# Patient Record
Sex: Female | Born: 1975 | Race: White | Hispanic: No | Marital: Married | Smoking: Never smoker
Health system: Southern US, Community
[De-identification: ages and names within clinical notes are randomized; demographics above are authoritative.]

## PROBLEM LIST (undated history)

## (undated) DIAGNOSIS — T8859XA Other complications of anesthesia, initial encounter: Secondary | ICD-10-CM

## (undated) DIAGNOSIS — J189 Pneumonia, unspecified organism: Secondary | ICD-10-CM

## (undated) DIAGNOSIS — M419 Scoliosis, unspecified: Secondary | ICD-10-CM

## (undated) DIAGNOSIS — R519 Headache, unspecified: Secondary | ICD-10-CM

## (undated) DIAGNOSIS — T7840XA Allergy, unspecified, initial encounter: Secondary | ICD-10-CM

## (undated) DIAGNOSIS — Z87440 Personal history of urinary (tract) infections: Secondary | ICD-10-CM

## (undated) DIAGNOSIS — F419 Anxiety disorder, unspecified: Secondary | ICD-10-CM

## (undated) DIAGNOSIS — R51 Headache: Secondary | ICD-10-CM

## (undated) DIAGNOSIS — Z789 Other specified health status: Secondary | ICD-10-CM

## (undated) DIAGNOSIS — Z9889 Other specified postprocedural states: Secondary | ICD-10-CM

## (undated) DIAGNOSIS — R112 Nausea with vomiting, unspecified: Secondary | ICD-10-CM

## (undated) DIAGNOSIS — T4145XA Adverse effect of unspecified anesthetic, initial encounter: Secondary | ICD-10-CM

## (undated) HISTORY — DX: Allergy, unspecified, initial encounter: T78.40XA

## (undated) HISTORY — PX: WISDOM TOOTH EXTRACTION: SHX21

## (undated) HISTORY — PX: TONSILLECTOMY: SUR1361

## (undated) HISTORY — DX: Scoliosis, unspecified: M41.9

---

## 2010-12-08 ENCOUNTER — Ambulatory Visit (INDEPENDENT_AMBULATORY_CARE_PROVIDER_SITE_OTHER): Payer: BC Managed Care – PPO | Admitting: Family Medicine

## 2010-12-08 ENCOUNTER — Ambulatory Visit: Payer: Self-pay | Admitting: Family Medicine

## 2010-12-08 ENCOUNTER — Encounter: Payer: Self-pay | Admitting: Family Medicine

## 2010-12-08 VITALS — BP 124/83 | HR 80 | Ht 64.0 in | Wt 225.0 lb

## 2010-12-08 DIAGNOSIS — M79673 Pain in unspecified foot: Secondary | ICD-10-CM | POA: Insufficient documentation

## 2010-12-08 DIAGNOSIS — M79609 Pain in unspecified limb: Secondary | ICD-10-CM

## 2010-12-08 NOTE — Progress Notes (Signed)
  Subjective:    Patient ID: Jane Mclaughlin, female    DOB: July 24, 1976, 35 y.o.   MRN: [id number]  HPI  35 yo F here for left heel pain  Patient reports slowly worsening left heel pain without injury over past 1 month. States started exercising walking and using elliptical 1 month before pain started. No running. Has stopped these activities 2/2 pain. Pain is worse at end of day, better in morning. Pain worse when pushing off on ball of foot walking. Pain located plantar aspect left heel. Tylenol not helping. Stretching has helped and feels pull where hurts at heel when she does this.  Past Medical History  Diagnosis Date  . Allergy   . Scoliosis     No current outpatient prescriptions on file prior to visit.    No past surgical history on file.  Allergies  Allergen Reactions  . Nsaids   . Penicillins     History   Social History  . Marital Status: Married    Spouse Name: N/A    Number of Children: N/A  . Years of Education: N/A   Occupational History  . Not on file.   Social History Main Topics  . Smoking status: Never Smoker   . Smokeless tobacco: Not on file  . Alcohol Use: Not on file  . Drug Use: Not on file  . Sexually Active: Not on file   Other Topics Concern  . Not on file   Social History Narrative  . No narrative on file    Family History  Problem Relation Age of Onset  . Hypertension Mother   . Heart attack Neg Hx   . Diabetes Neg Hx     There were no vitals taken for this visit.  Review of Systems See HPI above.    Objective:   Physical Exam Gen: NAD L foot/ankle: Mild overpronation but otherwise well preserved arch. No swelling, bruising, other deformity of left foot. Mild TTP plantar heel.  No TTP achilles or achilles insertion.  No TTP post tib, peroneal tendons.  No TTP within plantar fascia. FROM foot and ankle. 5/5 strength and no reproduction of pain on resisted testing Showed me that with walking, left foot behind her  in push off with heel off the ground reproduces pain plantar aspect of heel. Negative calcaneal squeeze test     Assessment & Plan:  1. Left heel pain - Patient's activities, negative calcaneal squeeze, lack of tenderness medial and lateral on calcaneus, not improving with rest from going to the gym point against calcaneal stress fracture.  Hurts worse with push off and stretching pulls at area of pain.  Most likely plantar fasciitis but cannot rule out a dynamic issue.  Never had a cortisone injection to cause fat pad atrophy.  Will treat with postop shoe with heel lift, arch straps, icing, tylenol for pain.  Exercises and stretches shown and demonstrated.  No running, walking.  Ok for elliptical as long as pain stays less than a 3/10 with this.  F/u in 1 month for recheck.  If not improving, will consider imaging.

## 2010-12-08 NOTE — Assessment & Plan Note (Signed)
Patient's activities, negative calcaneal squeeze, lack of tenderness medial and lateral on calcaneus, not improving with rest from going to the gym point against calcaneal stress fracture.  Hurts worse with push off and stretching pulls at area of pain.  Most likely plantar fasciitis but cannot rule out a dynamic issue.  Never had a cortisone injection to cause fat pad atrophy.  Will treat with postop shoe with heel lift, arch straps, icing, tylenol for pain.  Exercises and stretches shown and demonstrated.  No running, walking.  Ok for elliptical as long as pain stays less than a 3/10 with this.  F/u in 1 month for recheck.  If not improving, will consider imaging.

## 2010-12-08 NOTE — Patient Instructions (Signed)
Your symptoms and exam are most consistent with plantar fasciitis Take tylenol as needed for pain  Plantar fascia stretch for 20-30 seconds (do 3 of these) in morning and before bed. Lowering/raise on a step exercises 3 x 15 once or twice a day - this is very important for long term recovery - can start with 3 sets of 6 and work your way up if needed. Can add heel walks, toe walks forward and backward as well. Ice bucket 10-15 minutes at end of day Avoid flat shoes/barefoot walking as much as possible. Arch straps have been shown to help with pain. Heel lifts also help with pain by avoiding fully stretching the plantar fascia except when doing home exercises. Follow up with me in 4 weeks. If you are not improving as I would expect you to, we will proceed with imaging but the number of conditions in this area are limited and all respond well to rehab, icing, relative rest. Postop shoe with heel lift when up and walking around. Stay off feet as much as possible otherwise. >90% improve by 12 months with or without treatment but the above things improve the condition faster.

## 2011-01-05 ENCOUNTER — Ambulatory Visit: Payer: BC Managed Care – PPO | Admitting: Family Medicine

## 2011-01-12 ENCOUNTER — Ambulatory Visit: Payer: BC Managed Care – PPO | Admitting: Family Medicine

## 2011-05-03 LAB — OB RESULTS CONSOLE RPR: RPR: NONREACTIVE

## 2011-08-07 NOTE — L&D Delivery Note (Signed)
Delivery Note At 11:20 PM a viable and healthy female was delivered via Vaginal, Spontaneous Delivery (Presentation: LOA with Stephenson x one reduced on perineum ).  APGARs:pending; weight 8 lb 14 oz (4026 g).   Placenta status: spontaneous and intact with 3 vessel Cord:  with the following complications:none .  Cord pH: na  Anesthesia: Epidural  Episiotomy: none Lacerations: second degree Suture Repair: 2.0 3.0 vicryl rapide Est. Blood Loss (mL): 300  Mom to postpartum.  Baby to NICU to evaluate at bedside. Triage pending evaluation. Lenoard Aden 12/04/2011, 11:40 PM

## 2011-12-04 ENCOUNTER — Encounter (HOSPITAL_COMMUNITY): Payer: Self-pay | Admitting: *Deleted

## 2011-12-04 ENCOUNTER — Encounter (HOSPITAL_COMMUNITY): Payer: Self-pay

## 2011-12-04 ENCOUNTER — Inpatient Hospital Stay (HOSPITAL_COMMUNITY)
Admission: AD | Admit: 2011-12-04 | Discharge: 2011-12-04 | Disposition: A | Discharge status: Home / Self Care | Payer: BC Managed Care – PPO | Source: Ambulatory Visit | Attending: Obstetrics | Admitting: Obstetrics

## 2011-12-04 ENCOUNTER — Inpatient Hospital Stay (HOSPITAL_COMMUNITY)
Admission: AD | Admit: 2011-12-04 | Discharge: 2011-12-06 | DRG: 373 | Disposition: A | Discharge status: Home / Self Care | Payer: BC Managed Care – PPO | Source: Ambulatory Visit | Attending: Obstetrics and Gynecology | Admitting: Obstetrics and Gynecology

## 2011-12-04 ENCOUNTER — Encounter (HOSPITAL_COMMUNITY): Payer: Self-pay | Admitting: Anesthesiology

## 2011-12-04 ENCOUNTER — Inpatient Hospital Stay (HOSPITAL_COMMUNITY): Payer: BC Managed Care – PPO | Admitting: Anesthesiology

## 2011-12-04 DIAGNOSIS — O479 False labor, unspecified: Secondary | ICD-10-CM | POA: Insufficient documentation

## 2011-12-04 HISTORY — DX: Other specified health status: Z78.9

## 2011-12-04 LAB — CBC
Hemoglobin: 13.2 g/dL (ref 12.0–15.0)
MCH: 31.4 pg (ref 26.0–34.0)
MCHC: 33.6 g/dL (ref 30.0–36.0)
MCV: 93.3 fL (ref 78.0–100.0)
Platelets: 294 10*3/uL (ref 150–400)
RBC: 4.21 MIL/uL (ref 3.87–5.11)

## 2011-12-04 LAB — OB RESULTS CONSOLE ABO/RH: RH Type: NEGATIVE

## 2011-12-04 LAB — OB RESULTS CONSOLE RPR: RPR: REACTIVE

## 2011-12-04 LAB — OB RESULTS CONSOLE HEPATITIS B SURFACE ANTIGEN: Hepatitis B Surface Ag: NEGATIVE

## 2011-12-04 LAB — OB RESULTS CONSOLE GBS: GBS: NEGATIVE

## 2011-12-04 LAB — OB RESULTS CONSOLE HIV ANTIBODY (ROUTINE TESTING): HIV: NONREACTIVE

## 2011-12-04 MED ORDER — OXYTOCIN BOLUS FROM INFUSION
500.0000 mL | Freq: Once | INTRAVENOUS | Status: DC
  Filled 2011-12-04: qty 500

## 2011-12-04 MED ORDER — PHENYLEPHRINE 40 MCG/ML (10ML) SYRINGE FOR IV PUSH (FOR BLOOD PRESSURE SUPPORT): 80.0000 ug | PREFILLED_SYRINGE | INTRAVENOUS | Status: DC | PRN

## 2011-12-04 MED ORDER — OXYTOCIN 20 UNITS IN LACTATED RINGERS INFUSION - SIMPLE
1.0000 m[IU]/min | INTRAVENOUS | Status: DC
  Administered 2011-12-04: 2 m[IU]/min via INTRAVENOUS
  Filled 2011-12-04: qty 1000

## 2011-12-04 MED ORDER — LACTATED RINGERS IV SOLN: 500.0000 mL | INTRAVENOUS | Status: DC | PRN

## 2011-12-04 MED ORDER — ONDANSETRON HCL 4 MG/2ML IJ SOLN
4.0000 mg | Freq: Four times a day (QID) | INTRAMUSCULAR | Status: DC | PRN
  Administered 2011-12-04: 4 mg via INTRAVENOUS
  Filled 2011-12-04: qty 2

## 2011-12-04 MED ORDER — FENTANYL 2.5 MCG/ML BUPIVACAINE 1/10 % EPIDURAL INFUSION (WH - ANES)
14.0000 mL/h | INTRAMUSCULAR | Status: DC
  Administered 2011-12-04: 14 mL/h via EPIDURAL
  Filled 2011-12-04 (×2): qty 60

## 2011-12-04 MED ORDER — OXYTOCIN 20 UNITS IN LACTATED RINGERS INFUSION - SIMPLE: 125.0000 mL/h | Freq: Once | INTRAVENOUS | Status: DC

## 2011-12-04 MED ORDER — FLEET ENEMA 7-19 GM/118ML RE ENEM: 1.0000 | ENEMA | RECTAL | Status: DC | PRN

## 2011-12-04 MED ORDER — TERBUTALINE SULFATE 1 MG/ML IJ SOLN: 0.2500 mg | Freq: Once | INTRAMUSCULAR | Status: AC | PRN

## 2011-12-04 MED ORDER — LACTATED RINGERS IV SOLN: 500.0000 mL | Freq: Once | INTRAVENOUS | Status: DC

## 2011-12-04 MED ORDER — LIDOCAINE HCL (PF) 1 % IJ SOLN
30.0000 mL | INTRAMUSCULAR | Status: DC | PRN
  Administered 2011-12-04: 10 mL via SUBCUTANEOUS
  Filled 2011-12-04: qty 30

## 2011-12-04 MED ORDER — FENTANYL 2.5 MCG/ML BUPIVACAINE 1/10 % EPIDURAL INFUSION (WH - ANES)
INTRAMUSCULAR | Status: DC | PRN
  Administered 2011-12-04: 14 mL/h via EPIDURAL

## 2011-12-04 MED ORDER — ACETAMINOPHEN 325 MG PO TABS: 650.0000 mg | ORAL_TABLET | ORAL | Status: DC | PRN

## 2011-12-04 MED ORDER — DIPHENHYDRAMINE HCL 50 MG/ML IJ SOLN: 12.5000 mg | INTRAMUSCULAR | Status: DC | PRN

## 2011-12-04 MED ORDER — EPHEDRINE 5 MG/ML INJ
10.0000 mg | INTRAVENOUS | Status: AC | PRN
  Administered 2011-12-04 (×2): 10 mg via INTRAVENOUS
  Filled 2011-12-04: qty 4

## 2011-12-04 MED ORDER — SODIUM BICARBONATE 8.4 % IV SOLN
INTRAVENOUS | Status: DC | PRN
  Administered 2011-12-04: 4 mL via EPIDURAL

## 2011-12-04 MED ORDER — OXYCODONE-ACETAMINOPHEN 5-325 MG PO TABS
1.0000 | ORAL_TABLET | ORAL | Status: DC | PRN
  Administered 2011-12-05: 1 via ORAL
  Filled 2011-12-04: qty 1

## 2011-12-04 MED ORDER — LACTATED RINGERS IV SOLN
INTRAVENOUS | Status: DC
  Administered 2011-12-04 (×3): via INTRAVENOUS

## 2011-12-04 MED ORDER — CITRIC ACID-SODIUM CITRATE 334-500 MG/5ML PO SOLN: 30.0000 mL | ORAL | Status: DC | PRN

## 2011-12-04 MED ORDER — PHENYLEPHRINE 40 MCG/ML (10ML) SYRINGE FOR IV PUSH (FOR BLOOD PRESSURE SUPPORT)
80.0000 ug | PREFILLED_SYRINGE | INTRAVENOUS | Status: DC | PRN
  Administered 2011-12-04: 80 ug via INTRAVENOUS
  Administered 2011-12-04: 200 ug via INTRAVENOUS
  Filled 2011-12-04: qty 10
  Filled 2011-12-04: qty 5

## 2011-12-04 MED ORDER — EPHEDRINE 5 MG/ML INJ: 10.0000 mg | INTRAVENOUS | Status: DC | PRN

## 2011-12-04 NOTE — Progress Notes (Signed)
Jane Mclaughlin is a 36 y.o. G2P1001 at [redacted]w[redacted]d by LMP admitted for active labor  Subjective: Comfortable  Objective: BP 130/63  Pulse 111  Temp(Src) 97.8 F (36.6 C) (Oral)  Resp 20  Ht 5\' 5"  (1.651 m)  Wt 111.766 kg (246 lb 6.4 oz)  BMI 41.00 kg/m2  SpO2 100%      FHT:  FHR: 155 bpm, variability: moderate,  accelerations:  Present,  decelerations:  Absent UC:   irregular, every 2-5 minutes difficult to trace SVE:   6-7/100/-2 AROM- clear IUPC placed without difficulty  Labs: Lab Results  Component Value Date   WBC 12.4* 12/04/2011   HGB 13.2 12/04/2011   HCT 39.3 12/04/2011   MCV 93.3 12/04/2011   PLT 294 12/04/2011    Assessment / Plan: Augmentation of labor, progressing well with prolonged active phase.  Labor: Progressing normally now on Pitocin Preeclampsia:  na Fetal Wellbeing:  Category I Pain Control:  Epidural I/D:  n/a Anticipated MOD:  NSVD  Jaykub Mackins J 12/04/2011, 8:20 PM

## 2011-12-04 NOTE — MAU Note (Signed)
Patient state she has been having contractions for several days, more intense this am and went to the office. Was 3cm which was the same as yesterday and Friday. Had leaking fluid in the office and checked for ROM but determined that no rupture had occurred. Reports good fetal movement.

## 2011-12-04 NOTE — MAU Note (Signed)
Pt sent over from office for labor eval.  Reports ucs that woke her up this morning, but have slowed down since getting to office.  Was 3 cm in office.  Ruled out srom at office, reports leaking during contractions, unsure if urine.  Denies any bleeding.  + FM.

## 2011-12-04 NOTE — Anesthesia Preprocedure Evaluation (Signed)

## 2011-12-04 NOTE — Progress Notes (Signed)
Saudia Smyser is a 36 y.o. G2P1001 at [redacted]w[redacted]d by LMP admitted for active labor  Subjective: Feels pressure  Objective: BP 117/50  Pulse 129  Temp(Src) 98.4 F (36.9 C) (Oral)  Resp 20  Ht 5\' 5"  (1.651 m)  Wt 111.766 kg (246 lb 6.4 oz)  BMI 41.00 kg/m2  SpO2 99%      FHT:  FHR: 155 bpm, variability: moderate,  accelerations:  Present,  decelerations:  Absent UC:   regular, every 3 minutes SVE:   Ant lip/OT/+1  Labs: Lab Results  Component Value Date   WBC 12.4* 12/04/2011   HGB 13.2 12/04/2011   HCT 39.3 12/04/2011   MCV 93.3 12/04/2011   PLT 294 12/04/2011    Assessment / Plan: Augmentation of labor, progressing well  Labor: Progressing normally Preeclampsia:  na Fetal Wellbeing:  Category I Pain Control:  Epidural I/D:  n/a Anticipated MOD:  NSVD  Dion Sibal J 12/04/2011, 10:50 PM

## 2011-12-04 NOTE — H&P (Signed)
Jane Mclaughlin, Jane Mclaughlin              ACCOUNT NO.:  [id number]  MEDICAL RECORD NO.:  [id number]  LOCATION:  9175                          FACILITY:  [facility]  PHYSICIAN:  Lenoard Aden, M.D.DATE OF BIRTH:  1976-03-25  DATE OF ADMISSION:  12/04/2011 DATE OF DISCHARGE:                             HISTORY & PHYSICAL   CHIEF COMPLAINT:  Labor.  HISTORY OF PRESENT ILLNESS:  She is a 36 year old white female, G2, P1, at 38-5/7th weeks' gestation in active labor.  Medications are prenatal vitamins, Ventolin p.r.n. __________ NSAIDs __________ aspirin and penicillin.  She is a nonsmoker, nondrinker.  She denies domestic or physical violence.  She has a personal history of stress incontinence, postpartum depression, and abnormal Pap smear with no HPV exposure.  She has a family history of lung cancer, skin cancer, and heart disease, migraine with colon cancer.  __________  PRENATAL COURSE:  Otherwise uncomplicated.  SURGICAL HISTORY:  Tonsillectomy __________  PHYSICAL EXAMINATION:  GENERAL:  She is a well-developed, well- nourished, white female, in no acute distress. HEENT:  Normal. NECK:  Supple.  Full range of motion. LUNGS:  Clear. HEART:  Regular rhythm. ABDOMEN:  Soft, gravid.  Estimated fetal weight 8-1/2 pounds.  Cervix is 6 cm, 100% vertex, -2. EXTREMITIES:  There are no cords. NEUROLOGIC:  Intact.  IMPRESSION:  Term intrauterine pregnancy in active labor __________.  PLAN:  Admit, Pitocin augmentation, epidural as needed.  Anticipate cautious attempts to vaginal delivery.     Lenoard Aden, M.D.     RJT/MEDQ  D:  12/04/2011  T:  12/04/2011  Job:  [id number]

## 2011-12-04 NOTE — Anesthesia Procedure Notes (Signed)
Epidural Patient location during procedure: OB  Preanesthetic Checklist Completed: patient identified, site marked, surgical consent, pre-op evaluation, timeout performed, IV checked, risks and benefits discussed and monitors and equipment checked  Epidural Patient position: sitting Prep: site prepped and draped and DuraPrep Patient monitoring: continuous pulse ox and blood pressure Approach: midline Injection technique: LOR air  Needle:  Needle type: Tuohy  Needle gauge: 17 G Needle length: 9 cm Needle insertion depth: 7 cm Catheter type: closed end flexible Catheter size: 19 Gauge Catheter at skin depth: 14 cm Test dose: negative  Assessment Events: blood not aspirated, injection not painful, no injection resistance, negative IV test and no paresthesia  Additional Notes Marked scoliosis, difficult landmarks  Dosing of Epidural:  1st dose, through needle ............................................Marland Kitchen epi 1:200K + Xylocaine 40 mg  2nd dose, through catheter, after waiting 3 minutes...Marland KitchenMarland Kitchenepi 1:200K + Xylocaine 40 mg  3rd dose, through catheter after waiting 3 minutes .............................Marcaine   4mg    ( mg Marcaine are expressed as equivilent  cc's medication removed from the 0.1%Bupiv / fentanyl syringe from L&D pump)  ( 2% Xylo charted as a single dose in Epic Meds for ease of charting; actual dosing was fractionated as above, for saftey's sake)  As each dose occurred, patient was free of IV sx; and patient exhibited no evidence of SA injection.  Patient is more comfortable after epidural dosed. Please see RN's note for documentation of vital signs,and FHR which are stable.

## 2011-12-04 NOTE — H&P (Signed)
CC: ctx  HPI: 36 yo G2P1001 at 38'5 presents in latent labor. Pt notes ctx very strong early this am and even while in office earlier today. Ctx decreased over the past few hrs and now reports rare ctx when at rest. Only feeling ctx when walking. Good fetal movement, no VB, no LOF. Pt w/ IOL in prior pregnancy and desires not to be induced. Frustrated with ctx that have started and now stopped. Pt has been seen in office for the past 2 days with contractions.   All: PCN PSH: no abdominal, uterine or cervical surgeries. PObHx: term, SVD, pt unsure why she was induced in past.  PE: Filed Vitals:   12/04/11 0958  BP: 132/74  Pulse: 105  Temp: 97.6 F (36.4 C)  TempSrc: Oral  Resp: 20  Height: 5\' 5"  (1.651 m)  Weight: 111.766 kg (246 lb 6.4 oz)  SpO2: 100%   Gen: well appearing, mostly comfortable, breathing heavy with an occasional ctx Abd: gravid, NT, no RUQ pain Cvx: 4-5/ 100%/ vtx -2, membranes intact LE: NT, no edema FH: 120's, + accels, no decels, 10 beat variability Toco: rare earlier, 1 in 10 min now.  GBS neg  A/P: G2P1 in latent phase labor. Pt w/ 3 different examiners today and slight cervical change but unsure if this is from different examiners. Not in a regular contraction cycle so most likely slow latent phase. Reactive fetal testing, GBS neg, desiring to avoid augmentation and <39 wks. Offerred pt admission and augmentation but given above, recc to pt to manage expectantly at home and return w/ ROM or when ctx more consistent and painful. Pt agrees.   Tameron Lama A. 12/04/2011 12:21 PM

## 2011-12-04 NOTE — Discharge Instructions (Signed)
Normal Labor and Delivery Your caregiver must first be sure you are in labor. Signs of labor include:  You may pass what is called "the mucus plug" before labor begins. This is a small amount of blood stained mucus.   Regular uterine contractions.   The time between contractions get closer together.   The discomfort and pain gradually gets more intense.   Pains are mostly located in the back.   Pains get worse when walking.   The cervix (the opening of the uterus becomes thinner (begins to efface) and opens up (dilates).  Once you are in labor and admitted into the hospital or care center, your caregiver will do the following:  A complete physical examination.   Check your vital signs (blood pressure, pulse, temperature and the fetal heart rate).   Do a vaginal examination (using a sterile glove and lubricant) to determine:   The position (presentation) of the baby (head [vertex] or buttock first).   The level (station) of the baby's head in the birth canal.   The effacement and dilatation of the cervix.   You may have your pubic hair shaved and be given an enema depending on your caregiver and the circumstance.   An electronic monitor is usually placed on your abdomen. The monitor follows the length and intensity of the contractions, as well as the baby's heart rate.   Usually, your caregiver will insert an IV in your arm with a bottle of sugar water. This is done as a precaution so that medications can be given to you quickly during labor or delivery.  NORMAL LABOR AND DELIVERY IS DIVIDED UP INTO 3 STAGES: First Stage This is when regular contractions begin and the cervix begins to efface and dilate. This stage can last from 3 to 15 hours. The end of the first stage is when the cervix is 100% effaced and 10 centimeters dilated. Pain medications may be given by   Injection (morphine, demerol, etc.)   Regional anesthesia (spinal, caudal or epidural, anesthetics given in  different locations of the spine). Paracervical pain medication may be given, which is an injection of and anesthetic on each side of the cervix.  A pregnant woman may request to have "Natural Childbirth" which is not to have any medications or anesthesia during her labor and delivery. Second Stage This is when the baby comes down through the birth canal (vagina) and is born. This can take 1 to 4 hours. As the baby's head comes down through the birth canal, you may feel like you are going to have a bowel movement. You will get the urge to bear down and push until the baby is delivered. As the baby's head is being delivered, the caregiver will decide if an episiotomy (a cut in the perineum and vagina area) is needed to prevent tearing of the tissue in this area. The episiotomy is sewn up after the delivery of the baby and placenta. Sometimes a mask with nitrous oxide is given for the mother to breath during the delivery of the baby to help if there is too much pain. The end of Stage 2 is when the baby is fully delivered. Then when the umbilical cord stops pulsating it is clamped and cut. Third Stage The third stage begins after the baby is completely delivered and ends after the placenta (afterbirth) is delivered. This usually takes 5 to 30 minutes. After the placenta is delivered, a medication is given either by intravenous or injection to help contract   the uterus and prevent bleeding. The third stage is not painful and pain medication is usually not necessary. If an episiotomy was done, it is repaired at this time. After the delivery, the mother is watched and monitored closely for 1 to 2 hours to make sure there is no postpartum bleeding (hemorrhage). If there is a lot of bleeding, medication is given to contract the uterus and stop the bleeding. Document Released: 05/01/2008 Document Revised: 07/12/2011 Document Reviewed: 05/01/2008 [employer] Patient Information 2012 [employer]. 

## 2011-12-04 NOTE — Progress Notes (Signed)
Tieasha Larsen is a 36 y.o. G2P1001 at [redacted]w[redacted]d by LMP admitted for active labor  Subjective:   Objective: BP 133/68  Pulse 120  Temp(Src) 98.3 F (36.8 C) (Oral)  Resp 18  Ht 5\' 5"  (1.651 m)  Wt 111.766 kg (246 lb 6.4 oz)  BMI 41.00 kg/m2  SpO2 100%      FHT:  FHR: 155 bpm, variability: moderate,  accelerations:  Present,  decelerations:  Absent UC:   regular, every 2 minutes(110-130 MVU) SVE:   Dilation: Lip/rim Effacement (%): 100 Station: 0 Exam by:: [employer] RN  Labs: Lab Results  Component Value Date   WBC 12.4* 12/04/2011   HGB 13.2 12/04/2011   HCT 39.3 12/04/2011   MCV 93.3 12/04/2011   PLT 294 12/04/2011    Assessment / Plan: Augmentation of labor, progressing well  Labor: Progressing normally Preeclampsia:  na Fetal Wellbeing:  Category I Pain Control:  Epidural I/D:  n/a Anticipated MOD:  NSVD  Mayar Whittier J 12/04/2011, 10:05 PM

## 2011-12-05 ENCOUNTER — Encounter (HOSPITAL_COMMUNITY): Payer: Self-pay | Admitting: Obstetrics

## 2011-12-05 LAB — CBC
HCT: 35.2 % — ABNORMAL LOW (ref 36.0–46.0)
MCHC: 32.7 g/dL (ref 30.0–36.0)
MCV: 94.6 fL (ref 78.0–100.0)
RDW: 14.7 % (ref 11.5–15.5)
WBC: 17.6 10*3/uL — ABNORMAL HIGH (ref 4.0–10.5)

## 2011-12-05 LAB — RPR: RPR Ser Ql: NONREACTIVE

## 2011-12-05 MED ORDER — METHYLERGONOVINE MALEATE 0.2 MG PO TABS: 0.2000 mg | ORAL_TABLET | ORAL | Status: DC | PRN

## 2011-12-05 MED ORDER — METHYLERGONOVINE MALEATE 0.2 MG/ML IJ SOLN: 0.2000 mg | INTRAMUSCULAR | Status: DC | PRN

## 2011-12-05 MED ORDER — BENZOCAINE-MENTHOL 20-0.5 % EX AERO
1.0000 "application " | INHALATION_SPRAY | CUTANEOUS | Status: DC | PRN
  Administered 2011-12-05: 1 via TOPICAL
  Filled 2011-12-05: qty 56

## 2011-12-05 MED ORDER — SIMETHICONE 80 MG PO CHEW: 80.0000 mg | CHEWABLE_TABLET | ORAL | Status: DC | PRN

## 2011-12-05 MED ORDER — WITCH HAZEL-GLYCERIN EX PADS
1.0000 "application " | MEDICATED_PAD | CUTANEOUS | Status: DC | PRN
  Administered 2011-12-05: 1 via TOPICAL

## 2011-12-05 MED ORDER — DIPHENHYDRAMINE HCL 25 MG PO CAPS: 25.0000 mg | ORAL_CAPSULE | Freq: Four times a day (QID) | ORAL | Status: DC | PRN

## 2011-12-05 MED ORDER — TETANUS-DIPHTH-ACELL PERTUSSIS 5-2.5-18.5 LF-MCG/0.5 IM SUSP
0.5000 mL | Freq: Once | INTRAMUSCULAR | Status: AC
  Administered 2011-12-05: 0.5 mL via INTRAMUSCULAR
  Filled 2011-12-05: qty 0.5

## 2011-12-05 MED ORDER — OXYCODONE-ACETAMINOPHEN 5-325 MG PO TABS
1.0000 | ORAL_TABLET | ORAL | Status: DC | PRN
  Administered 2011-12-05: 1 via ORAL
  Administered 2011-12-05: 2 via ORAL
  Administered 2011-12-05 – 2011-12-06 (×6): 1 via ORAL
  Filled 2011-12-05 (×8): qty 1

## 2011-12-05 MED ORDER — DIBUCAINE 1 % RE OINT
1.0000 "application " | TOPICAL_OINTMENT | RECTAL | Status: DC | PRN
  Administered 2011-12-05: 1 via RECTAL
  Filled 2011-12-05: qty 28

## 2011-12-05 MED ORDER — OXYTOCIN 20 UNITS IN LACTATED RINGERS INFUSION - SIMPLE: 125.0000 mL/h | INTRAVENOUS | Status: DC

## 2011-12-05 MED ORDER — LANOLIN HYDROUS EX OINT: TOPICAL_OINTMENT | CUTANEOUS | Status: DC | PRN

## 2011-12-05 MED ORDER — ONDANSETRON HCL 4 MG PO TABS: 4.0000 mg | ORAL_TABLET | ORAL | Status: DC | PRN

## 2011-12-05 MED ORDER — ONDANSETRON HCL 4 MG/2ML IJ SOLN: 4.0000 mg | INTRAMUSCULAR | Status: DC | PRN

## 2011-12-05 MED ORDER — PRENATAL MULTIVITAMIN CH
1.0000 | ORAL_TABLET | Freq: Every day | ORAL | Status: DC
  Administered 2011-12-05 – 2011-12-06 (×2): 1 via ORAL
  Filled 2011-12-05 (×2): qty 1

## 2011-12-05 MED ORDER — ZOLPIDEM TARTRATE 5 MG PO TABS: 5.0000 mg | ORAL_TABLET | Freq: Every evening | ORAL | Status: DC | PRN

## 2011-12-05 MED ORDER — SENNOSIDES-DOCUSATE SODIUM 8.6-50 MG PO TABS
2.0000 | ORAL_TABLET | Freq: Every day | ORAL | Status: DC
  Administered 2011-12-05: 2 via ORAL

## 2011-12-05 NOTE — Progress Notes (Signed)
PPD 1 SVD  S:  Reports feeling well.             Tolerating po/ No nausea or vomiting             Bleeding is light             Pain controlled with Tylenol             Up ad lib / ambulatory / voiding well.   Newborn  Information for the patient's newborn:  Shanese, Riemenschneider [[id number]]  female  breast feeding     O:  A & O x 3 NAD             VS:  Filed Vitals:   12/05/11 0017 12/05/11 0100 12/05/11 0230 12/05/11 0615  BP: 129/69 108/70 100/66 111/73  Pulse: 116 109 125 91  Temp:  98.7 F (37.1 C) 98.5 F (36.9 C) 97.8 F (36.6 C)  TempSrc:  Oral Oral Oral  Resp:  22 26 20   Height:      Weight:      SpO2:        LABS:  Basename 12/05/11 0520 12/04/11 1715  WBC 17.6* 12.4*  HGB 11.5* 13.2  HCT 35.2* 39.3  PLT 284 294    Blood type: --/--/A NEG (04/30 1715) / infant Rh neg  Rubella: Immune (04/30 1711)   I&O: I/O last 3 completed shifts: In: -  Out: 300 [Blood:300]      Lungs: Clear and unlabored  Heart: regular rate and rhythm / no murmurs  Abdomen: soft, non-tender, obese             Fundus: firm, non-tender, @U   Perineum: repair intact, (+) hemorrhoids, flaccid  Lochia: small  Extremities: trace edema, no calf pain or tenderness, neg Homans    A/P: PPD # 1 36 y.o., [id number]    Principal Problem:  *PP care - s/p NVB 4/30   Doing well - stable status  Routine post partum orders  Anticipate discharge home in AM.   Analycia Khokhar, CNM, MSN 12/05/2011, 11:19 AM

## 2011-12-06 MED ORDER — OXYCODONE-ACETAMINOPHEN 5-325 MG PO TABS: 1.0000 | ORAL_TABLET | ORAL | Status: AC | PRN

## 2011-12-06 NOTE — Discharge Summary (Signed)
Obstetric Discharge Summary  Reason for Admission: onset of labor Prenatal Procedures: none Intrapartum Procedures: spontaneous vaginal delivery Postpartum Procedures: none Complications-Operative and Postpartum: 2nd degree perineal laceration Hemoglobin  Date Value Range Status  12/05/2011 11.5* 12.0-15.0 (g/dL) Final     HCT  Date Value Range Status  12/05/2011 35.2* 36.0-46.0 (%) Final    Physical Exam:  General: alert, cooperative and no distress Lochia: appropriate Uterine Fundus: firm Incision: healing well DVT Evaluation: No evidence of DVT seen on physical exam.  Discharge Diagnoses: Term Pregnancy-delivered  Discharge Information: Date: 12/06/2011 Activity: pelvic rest Diet: routine Medications: PNV and percocet Condition: stable Instructions: refer to practice specific booklet Discharge to: home Follow-up Information    Follow up with LAVOIE,MARIE-LYNE, MD. Schedule an appointment as soon as possible for a visit in 6 weeks.   Contact information:   [address] [place] [address] [phone]          Newborn Data: Live born female  Birth Weight: 8 lb 14 oz (4026 g) APGAR: 6, 8  Home with mother.  Marlinda Mike 12/06/2011, 10:06 AM

## 2011-12-06 NOTE — Progress Notes (Signed)
PPD 2 SVD  S:  Reports feeling well             Tolerating po/ No nausea or vomiting             Bleeding is light             Pain controlled withnarcotic analgesia             Up ad lib / ambulatory  Newborn breast feeding  / female newborn   O:  A & O x 3 NAd             VS: Blood pressure 104/65, pulse 67, temperature 97.7 F (36.5 C), temperature source Oral, resp. rate 18, height 5\' 5"  (1.651 m), weight 111.766 kg (246 lb 6.4 oz), SpO2 100.00%, unknown if currently breastfeeding.  Lungs: Clear and unlabored  Heart: regular rate and rhythm   Abdomen: soft, non-tender, non-distended              Fundus: firm, non-tender, U-1  Perineum: no edema  Lochia: light  Extremities: no edema, no calf pain or tenderness    A: PPD # 2   Doing well - stable status  P:  Routine post partum orders  Discharge home  Marlinda Mike 12/06/2011, 10:03 AM

## 2011-12-07 NOTE — Addendum Note (Signed)
Addendum  created 12/07/11 0857 by Jiles Garter, MD   Modules edited:Notes Section

## 2011-12-07 NOTE — Anesthesia Postprocedure Evaluation (Signed)
  Anesthesia Post-op Note  Patient: Jane Mclaughlin  This patient has recovered from her labor epidural, and I am not aware of any complications or problems.

## 2014-06-07 ENCOUNTER — Encounter (HOSPITAL_COMMUNITY): Payer: Self-pay | Admitting: Obstetrics

## 2014-08-13 ENCOUNTER — Emergency Department (HOSPITAL_BASED_OUTPATIENT_CLINIC_OR_DEPARTMENT_OTHER)
Admission: EM | Admit: 2014-08-13 | Discharge: 2014-08-13 | Disposition: A | Discharge status: Home / Self Care | Payer: BC Managed Care – PPO | Attending: Emergency Medicine | Admitting: Emergency Medicine

## 2014-08-13 ENCOUNTER — Emergency Department (HOSPITAL_BASED_OUTPATIENT_CLINIC_OR_DEPARTMENT_OTHER): Payer: BC Managed Care – PPO

## 2014-08-13 ENCOUNTER — Encounter (HOSPITAL_BASED_OUTPATIENT_CLINIC_OR_DEPARTMENT_OTHER): Payer: Self-pay

## 2014-08-13 DIAGNOSIS — K8020 Calculus of gallbladder without cholecystitis without obstruction: Secondary | ICD-10-CM | POA: Insufficient documentation

## 2014-08-13 DIAGNOSIS — R1010 Upper abdominal pain, unspecified: Secondary | ICD-10-CM

## 2014-08-13 DIAGNOSIS — J45909 Unspecified asthma, uncomplicated: Secondary | ICD-10-CM | POA: Insufficient documentation

## 2014-08-13 DIAGNOSIS — Z880 Allergy status to penicillin: Secondary | ICD-10-CM | POA: Diagnosis not present

## 2014-08-13 DIAGNOSIS — Z8739 Personal history of other diseases of the musculoskeletal system and connective tissue: Secondary | ICD-10-CM | POA: Diagnosis not present

## 2014-08-13 DIAGNOSIS — K8050 Calculus of bile duct without cholangitis or cholecystitis without obstruction: Secondary | ICD-10-CM | POA: Insufficient documentation

## 2014-08-13 DIAGNOSIS — Z79899 Other long term (current) drug therapy: Secondary | ICD-10-CM | POA: Diagnosis not present

## 2014-08-13 DIAGNOSIS — Z3202 Encounter for pregnancy test, result negative: Secondary | ICD-10-CM | POA: Diagnosis not present

## 2014-08-13 LAB — URINALYSIS, ROUTINE W REFLEX MICROSCOPIC
Bilirubin Urine: NEGATIVE
Glucose, UA: NEGATIVE mg/dL
HGB URINE DIPSTICK: NEGATIVE
Ketones, ur: 15 mg/dL — AB
Leukocytes, UA: NEGATIVE
Nitrite: NEGATIVE
PH: 7 (ref 5.0–8.0)
Protein, ur: NEGATIVE mg/dL
Specific Gravity, Urine: 1.022 (ref 1.005–1.030)
Urobilinogen, UA: 0.2 mg/dL (ref 0.0–1.0)

## 2014-08-13 LAB — CBC WITH DIFFERENTIAL/PLATELET
BASOS ABS: 0 10*3/uL (ref 0.0–0.1)
Basophils Relative: 0 % (ref 0–1)
Eosinophils Absolute: 0.2 10*3/uL (ref 0.0–0.7)
Eosinophils Relative: 2 % (ref 0–5)
HEMATOCRIT: 41 % (ref 36.0–46.0)
Hemoglobin: 13.7 g/dL (ref 12.0–15.0)
LYMPHS PCT: 35 % (ref 12–46)
Lymphs Abs: 3.2 10*3/uL (ref 0.7–4.0)
MCH: 31.1 pg (ref 26.0–34.0)
MCHC: 33.4 g/dL (ref 30.0–36.0)
MCV: 93.2 fL (ref 78.0–100.0)
MONOS PCT: 7 % (ref 3–12)
Monocytes Absolute: 0.6 10*3/uL (ref 0.1–1.0)
NEUTROS ABS: 5.2 10*3/uL (ref 1.7–7.7)
Neutrophils Relative %: 56 % (ref 43–77)
PLATELETS: 320 10*3/uL (ref 150–400)
RBC: 4.4 MIL/uL (ref 3.87–5.11)
RDW: 12.6 % (ref 11.5–15.5)
WBC: 9.2 10*3/uL (ref 4.0–10.5)

## 2014-08-13 LAB — COMPREHENSIVE METABOLIC PANEL
ALBUMIN: 4.1 g/dL (ref 3.5–5.2)
ALK PHOS: 65 U/L (ref 39–117)
ALT: 22 U/L (ref 0–35)
ANION GAP: 7 (ref 5–15)
AST: 18 U/L (ref 0–37)
BUN: 13 mg/dL (ref 6–23)
CHLORIDE: 105 meq/L (ref 96–112)
CO2: 28 mmol/L (ref 19–32)
CREATININE: 0.89 mg/dL (ref 0.50–1.10)
Calcium: 9.2 mg/dL (ref 8.4–10.5)
GFR calc Af Amer: >90 mL/min (ref >90)
GFR calc non Af Amer: 82 mL/min — ABNORMAL LOW (ref >90)
Glucose, Bld: 101 mg/dL — ABNORMAL HIGH (ref 70–99)
Potassium: 3.7 mmol/L (ref 3.5–5.1)
SODIUM: 140 mmol/L (ref 135–145)
Total Bilirubin: 0.3 mg/dL (ref 0.3–1.2)
Total Protein: 7.5 g/dL (ref 6.0–8.3)

## 2014-08-13 LAB — LIPASE, BLOOD: LIPASE: 41 U/L (ref 11–59)

## 2014-08-13 LAB — PREGNANCY, URINE: Preg Test, Ur: NEGATIVE

## 2014-08-13 MED ORDER — ONDANSETRON HCL 4 MG PO TABS: 4.0000 mg | ORAL_TABLET | Freq: Four times a day (QID) | ORAL | Status: DC

## 2014-08-13 MED ORDER — HYDROCODONE-ACETAMINOPHEN 5-325 MG PO TABS: 2.0000 | ORAL_TABLET | ORAL | Status: DC | PRN

## 2014-08-13 MED ORDER — ONDANSETRON HCL 4 MG/2ML IJ SOLN
4.0000 mg | Freq: Once | INTRAMUSCULAR | Status: AC
  Administered 2014-08-13: 4 mg via INTRAVENOUS
  Filled 2014-08-13: qty 2

## 2014-08-13 MED ORDER — SODIUM CHLORIDE 0.9 % IV SOLN
Freq: Once | INTRAVENOUS | Status: AC
  Administered 2014-08-13: 16:00:00 via INTRAVENOUS

## 2014-08-13 MED ORDER — HYDROMORPHONE HCL 1 MG/ML IJ SOLN
1.0000 mg | Freq: Once | INTRAMUSCULAR | Status: AC
  Administered 2014-08-13: 1 mg via INTRAVENOUS
  Filled 2014-08-13: qty 1

## 2014-08-13 MED ORDER — METOCLOPRAMIDE HCL 5 MG/ML IJ SOLN
10.0000 mg | Freq: Once | INTRAMUSCULAR | Status: AC
  Administered 2014-08-13: 10 mg via INTRAVENOUS
  Filled 2014-08-13: qty 2

## 2014-08-13 NOTE — Discharge Instructions (Signed)
Cholelithiasis °Cholelithiasis (also called gallstones) is a form of gallbladder disease in which gallstones form in your gallbladder. The gallbladder is an organ that stores bile made in the liver, which helps digest fats. Gallstones begin as small crystals and slowly grow into stones. Gallstone pain occurs when the gallbladder spasms and a gallstone is blocking the duct. Pain can also occur when a Dorion passes out of the duct.  °RISK FACTORS °· Being female.   °· Having multiple pregnancies. Health care providers sometimes advise removing diseased gallbladders before future pregnancies.   °· Being obese. °· Eating a diet heavy in fried foods and fat.   °· Being older than 60 years and increasing age.   °· Prolonged use of medicines containing female hormones.   °· Having diabetes mellitus.   °· Rapidly losing weight.   °· Having a family history of gallstones (heredity).   °SYMPTOMS °· Nausea.   °· Vomiting. °· Abdominal pain.   °· Yellowing of the skin (jaundice).   °· Sudden pain. It may persist from several minutes to several hours. °· Fever.   °· Tenderness to the touch.  °In some cases, when gallstones do not move into the bile duct, people have no pain or symptoms. These are called "silent" gallstones.  °TREATMENT °Silent gallstones do not need treatment. In severe cases, emergency surgery may be required. Options for treatment include: °· Surgery to remove the gallbladder. This is the most common treatment. °· Medicines. These do not always work and may take 6-12 months or more to work. °· Shock wave treatment (extracorporeal biliary lithotripsy). In this treatment an ultrasound machine sends shock waves to the gallbladder to break gallstones into smaller pieces that can pass into the intestines or be dissolved by medicine. °HOME CARE INSTRUCTIONS  °· Only take over-the-counter or prescription medicines for pain, discomfort, or fever as directed by your health care provider.   °· Follow a low-fat diet until  seen again by your health care provider. Fat causes the gallbladder to contract, which can result in pain.   °· Follow up with your health care provider as directed. Attacks are almost always recurrent and surgery is usually required for permanent treatment.   °SEEK IMMEDIATE MEDICAL CARE IF:  °· Your pain increases and is not controlled by medicines.   °· You have a fever or persistent symptoms for more than 2-3 days.   °· You have a fever and your symptoms suddenly get worse.   °· You have persistent nausea and vomiting.   °MAKE SURE YOU:  °· Understand these instructions. °· Will watch your condition. °· Will get help right away if you are not doing well or get worse. °Document Released: 07/19/2005 Document Revised: 03/25/2013 Document Reviewed: 01/14/2013 °ExitCare® Patient Information ©2015 [employer]. This information is not intended to replace advice given to you by your health care provider. Make sure you discuss any questions you have with your health care provider. ° °

## 2014-08-13 NOTE — ED Provider Notes (Signed)
CSN: [id number]     Arrival date & time 08/13/14  1453 History   First MD Initiated Contact with Patient 08/13/14 1528     Chief Complaint  Patient presents with  . Abdominal Pain     (Consider location/radiation/quality/duration/timing/severity/associated sxs/prior Treatment) HPI Comments: Patient presents to the ER for evaluation of upper abdominal pain. Symptoms began yesterday. Patient reports that it started as a burning sensation in the center of her upper abdomen, but now has worsened. She reports that she has a history of acid reflux, but it generally gets better with Zantac. Her symptoms have not improved with treatment. Patient reports the pain is now more severe, radiating down both sides of the abdomen and into the center of her back.  Patient is a 39 y.o. female presenting with abdominal pain.  Abdominal Pain   Past Medical History  Diagnosis Date  . Allergy   . Scoliosis   . No pertinent past medical history   . Asthma   . PP care - s/p NVB 4/30 12/05/2011   Past Surgical History  Procedure Laterality Date  . Tonsillectomy     Family History  Problem Relation Age of Onset  . Hypertension Mother   . Heart attack Neg Hx   . Diabetes Neg Hx   . Anesthesia problems Neg Hx    History  Substance Use Topics  . Smoking status: Never Smoker   . Smokeless tobacco: Not on file  . Alcohol Use: No   OB History    Gravida Para Term Preterm AB TAB SAB Ectopic Multiple Living   2 2 2  0 0 0 0 0 0 2     Review of Systems  Gastrointestinal: Positive for abdominal pain.  All other systems reviewed and are negative.     Allergies  Nsaids and Penicillins  Home Medications   Prior to Admission medications   Medication Sig Start Date End Date Taking? Authorizing Provider  calcium carbonate (TUMS - DOSED IN MG ELEMENTAL CALCIUM) 500 MG chewable tablet Chew 1 tablet by mouth daily. For heartburn.    Historical Provider, MD  Prenatal Vit-Fe Psac Cmplx-FA (PRENATAL  MULTIVITAMIN) 60-1 MG tablet Take 1 tablet by mouth daily with breakfast.      Historical Provider, MD   BP 142/58 mmHg  Pulse 69  Temp(Src) 98.5 F (36.9 C) (Oral)  Resp 16  Ht 5\' 7"  (1.702 m)  Wt 267 lb (121.11 kg)  BMI 41.81 kg/m2  SpO2 100%  LMP 07/20/2014 (Approximate) Physical Exam  Constitutional: She is oriented to person, place, and time. She appears well-developed and well-nourished. No distress.  HENT:  Head: Normocephalic and atraumatic.  Right Ear: Hearing normal.  Left Ear: Hearing normal.  Nose: Nose normal.  Mouth/Throat: Oropharynx is clear and moist and mucous membranes are normal.  Eyes: Conjunctivae and EOM are normal. Pupils are equal, round, and reactive to light.  Neck: Normal range of motion. Neck supple.  Cardiovascular: Regular rhythm, S1 normal and S2 normal.  Exam reveals no gallop and no friction rub.   No murmur heard. Pulmonary/Chest: Effort normal and breath sounds normal. No respiratory distress. She exhibits no tenderness.  Abdominal: Soft. Normal appearance and bowel sounds are normal. There is no hepatosplenomegaly. There is tenderness in the right upper quadrant and epigastric area. There is no rebound, no guarding, no tenderness at McBurney's point and negative Murphy's sign. No hernia.  Musculoskeletal: Normal range of motion.  Neurological: She is alert and oriented to person, place, and  time. She has normal strength. No cranial nerve deficit or sensory deficit. Coordination normal. GCS eye subscore is 4. GCS verbal subscore is 5. GCS motor subscore is 6.  Skin: Skin is warm, dry and intact. No rash noted. No cyanosis.  Psychiatric: She has a normal mood and affect. Her speech is normal and behavior is normal. Thought content normal.  Nursing note and vitals reviewed.   ED Course  Procedures (including critical care time) Labs Review Labs Reviewed  URINALYSIS, ROUTINE W REFLEX MICROSCOPIC - Abnormal; Notable for the following:    Ketones,  ur 15 (*)    All other components within normal limits  PREGNANCY, URINE  CBC WITH DIFFERENTIAL  COMPREHENSIVE METABOLIC PANEL  LIPASE, BLOOD    Imaging Review No results found.   EKG Interpretation   Date/Time:  Friday August 13 2014 15:10:20 EST Ventricular Rate:  72 PR Interval:  148 QRS Duration: 84 QT Interval:  368 QTC Calculation: 402 R Axis:   18 Text Interpretation:  Normal sinus rhythm Normal ECG Confirmed by Tache Bobst   MD, Kailene Steinhart [phone]) on 08/13/2014 3:16:25 PM      MDM   Final diagnoses:  Upper abdominal pain   biliary colic  Patient presents to the ER for evaluation of abdominal pain. Patient did have tenderness in the right upper quadrant. Workup reveals evidence of cholelithiasis without any evidence of cholecystitis. She had significant improvement with medications here in the ER. Patient is appropriate for discharge, outpatient follow-up with general surgery. Return to [facility] or [facility] if she has worsening symptoms over the weekend.    Gilda Crease, MD 08/13/14 [phone]

## 2014-08-13 NOTE — ED Notes (Signed)
Reports upper abdominal pain x 2 days. Reports n/v. Seen at UC this morning but pain has gotten worse.

## 2014-08-18 ENCOUNTER — Other Ambulatory Visit (INDEPENDENT_AMBULATORY_CARE_PROVIDER_SITE_OTHER): Payer: Self-pay | Admitting: Surgery

## 2014-08-30 ENCOUNTER — Encounter (HOSPITAL_COMMUNITY): Payer: Self-pay | Admitting: *Deleted

## 2014-08-30 MED ORDER — CIPROFLOXACIN IN D5W 400 MG/200ML IV SOLN
400.0000 mg | INTRAVENOUS | Status: AC
  Administered 2014-08-31: 400 mg via INTRAVENOUS
  Filled 2014-08-30: qty 200

## 2014-08-30 NOTE — Progress Notes (Signed)
Pt states she had a stress test in 2009 in [place] because an EKG she had showed a RBBB. She states that she was told the stress test was normal. EKG that she had done in the ED recently was NSR, no RBBB noted.

## 2014-08-30 NOTE — H&P (Signed)
Jane Mclaughlin 08/18/2014 2:38 PM Location: [facility] Patient #: [id number] DOB: 1976/06/05 Married / Language: English / Race: White Female  History of Present Illness (Chesney Suares A. Magnus Ivan MD; 08/18/2014 2:56 PM) Patient words: Gallstones.  The patient is a 39 year old female who presents for evaluation of gall stones. She is referred by [facility] for symptomatic cholelithiasis. She has been having attacks over the past year. They have been mild until this most recent attack where she had severe epigastric abdominal pain hurting due to the back. She had emesis with this. Today she currently has only mild discomfort. Her bowel movements have been slightly constipated. She is otherwise without complaints.   Other Problems Kerrie Buffalo, CMA; 08/18/2014 2:38 PM) Anxiety Disorder Asthma Back Pain Bladder Problems Cholelithiasis Gastroesophageal Reflux Disease  Past Surgical History Kerrie Buffalo, CMA; 08/18/2014 2:38 PM) Oral Surgery Tonsillectomy  Diagnostic Studies History Kerrie Buffalo, CMA; 08/18/2014 2:38 PM) Colonoscopy never Mammogram never Pap Smear 1-5 years ago  Allergies Kerrie Buffalo, CMA; 08/18/2014 2:39 PM) Penicillins NSAIDs  Medication History Kerrie Buffalo, CMA; 08/18/2014 2:40 PM) Hydrocodone-Acetaminophen (10-325MG  Tablet, Oral) Active. Zofran (  Tablet, Oral) Active.  Social History Kerrie Buffalo, [place]; 08/18/2014 2:38 PM) No drug use Tobacco use Never smoker.  Family History Kerrie Buffalo, [place]; 08/18/2014 2:38 PM) Cancer Father. Colon Polyps Mother. Hypertension Mother.  Pregnancy / Birth History Kerrie Buffalo, CMA; 08/18/2014 2:38 PM) Age at menarche 13 years. Contraceptive History Depo-provera, Oral contraceptives. Gravida 3 Maternal age 68-30 Para 2 Regular periods  Review of Systems Kerrie Buffalo CMA; 08/18/2014 2:38 PM) General Present- Appetite Loss and Fatigue. Not  Present- Chills, Fever, Night Sweats, Weight Gain and Weight Loss. HEENT Not Present- Earache, Hearing Loss, Hoarseness, Nose Bleed, Oral Ulcers, Ringing in the Ears, Seasonal Allergies, Sinus Pain, Sore Throat, Visual Disturbances, Wears glasses/contact lenses and Yellow Eyes. Respiratory Not Present- Bloody sputum, Chronic Cough, Difficulty Breathing, Snoring and Wheezing. Breast Not Present- Breast Mass, Breast Pain, Nipple Discharge and Skin Changes. Gastrointestinal Present- Abdominal Pain, Bloating, Constipation, Hemorrhoids and Nausea. Not Present- Bloody Stool, Change in Bowel Habits, Chronic diarrhea, Difficulty Swallowing, Excessive gas, Gets full quickly at meals, Indigestion, Rectal Pain and Vomiting. Female Genitourinary Not Present- Frequency, Nocturia, Painful Urination, Pelvic Pain and Urgency. Musculoskeletal Present- Back Pain. Not Present- Joint Pain, Joint Stiffness, Muscle Pain, Muscle Weakness and Swelling of Extremities. Neurological Not Present- Decreased Memory, Fainting, Headaches, Numbness, Seizures, Tingling, Tremor, Trouble walking and Weakness. Psychiatric Present- Anxiety. Not Present- Bipolar, Change in Sleep Pattern, Depression, Fearful and Frequent crying. Endocrine Not Present- Cold Intolerance, Excessive Hunger, Hair Changes, Heat Intolerance, Hot flashes and New Diabetes. Hematology Not Present- Easy Bruising, Excessive bleeding, Gland problems, HIV and Persistent Infections.   Vitals Kerrie Buffalo CMA; 08/18/2014 2:43 PM) 08/18/2014 2:40 PM Weight: 235.13 lb Height: 64in Body Surface Area: 2.19 m Body Mass Index: 40.36 kg/m Temp.: 97.104F  Pulse: 68 (Regular)  BP: 120/78 (Sitting, Left Arm, Standard)    Physical Exam (Jazmaine Fuelling A. Magnus Ivan MD; 08/18/2014 2:57 PM) General Mental Status-Alert. General Appearance-Consistent with stated age. Hydration-Well hydrated. Voice-Normal.  Head and Neck Head-normocephalic, atraumatic with no  lesions or palpable masses.  Eye Eyeball - Bilateral-Extraocular movements intact. Sclera/Conjunctiva - Bilateral-No scleral icterus.  Chest and Lung Exam Chest and lung exam reveals -quiet, even and easy respiratory effort with no use of accessory muscles and on auscultation, normal breath sounds, no adventitious sounds and normal vocal resonance. Inspection Chest Wall - Normal. Back - normal.  Cardiovascular Cardiovascular examination  reveals -on palpation PMI is normal in location and amplitude, no palpable S3 or S4. Normal cardiac borders., normal heart sounds, regular rate and rhythm with no murmurs, carotid auscultation reveals no bruits and normal pedal pulses bilaterally.  Abdomen Inspection Inspection of the abdomen reveals - No Hernias. Skin - Scar - no surgical scars. Palpation/Percussion Palpation and Percussion of the abdomen reveal - Soft, No Rebound tenderness, No Rigidity (guarding) and No hepatosplenomegaly. Tenderness - Right Upper Quadrant. Note: There is mild tenderness with guarding in the right upper quadrant. Auscultation Auscultation of the abdomen reveals - Bowel sounds normal.  Neurologic Neurologic evaluation reveals -alert and oriented x 3 with no impairment of recent or remote memory. Mental Status-Normal.  Musculoskeletal Normal Exam - Left-Upper Extremity Strength Normal and Lower Extremity Strength Normal. Normal Exam - Right-Upper Extremity Strength Normal, Lower Extremity Weakness.    Assessment & Plan (Tytiana Coles A. Magnus IvanBlackman MD; 08/18/2014 2:57 PM) SYMPTOMATIC CHOLELITHIASIS (574.20  K80.20) Impression: I discussed this with her in detail. Laparoscopic cholecystectomy is recommended. I gave her literature regarding the surgery. I discussed the risks which includes but is not limited to bleeding, infection, injury to surrounding structures, bile leak, bile duct injury, the need to convert to an open procedure, DVT, postoperative  recovery, etc. She understands and wishes to proceed. Current Plans  Started Hydrocodone-Acetaminophen 5-325MG , 1 (one) Tablet every four hours, as needed, #40, 08/18/2014, No Refill. Started Zofran 4MG , 1 (one) Tablet four times daily, as needed, #30, 08/18/2014, No Refill.

## 2014-08-30 NOTE — Anesthesia Preprocedure Evaluation (Addendum)
Anesthesia Evaluation  Patient identified by MRN, date of birth, ID band Patient awake    Reviewed: Allergy & Precautions, NPO status , Patient's Chart, lab work & pertinent test results, reviewed documented beta blocker date and time   History of Anesthesia Complications (+) PONV  Airway Mallampati: II   Neck ROM: Full    Dental  (+) Teeth Intact   Pulmonary asthma (inhalers) ,  breath sounds clear to auscultation        Cardiovascular negative cardio ROS  Rhythm:Regular     Neuro/Psych Anxiety    GI/Hepatic negative GI ROS, Neg liver ROS,   Endo/Other  negative endocrine ROS  Renal/GU negative Renal ROS     Musculoskeletal   Abdominal (+)  Abdomen: soft.    Peds  Hematology negative hematology ROS (+)   Anesthesia Other Findings   Reproductive/Obstetrics                           Anesthesia Physical Anesthesia Plan  ASA: III  Anesthesia Plan: General   Post-op Pain Management:    Induction: Intravenous  Airway Management Planned: Oral ETT  Additional Equipment:   Intra-op Plan:   Post-operative Plan: Extubation in OR  Informed Consent: I have reviewed the patients History and Physical, chart, labs and discussed the procedure including the risks, benefits and alternatives for the proposed anesthesia with the patient or authorized representative who has indicated his/her understanding and acceptance.     Plan Discussed with:   Anesthesia Plan Comments: (Check am HCG)       Anesthesia Quick Evaluation

## 2014-08-31 ENCOUNTER — Encounter (HOSPITAL_COMMUNITY)
Admission: RE | Disposition: A | Discharge status: Home / Self Care | Payer: Self-pay | Source: Ambulatory Visit | Attending: Surgery

## 2014-08-31 ENCOUNTER — Ambulatory Visit (HOSPITAL_COMMUNITY): Payer: BC Managed Care – PPO | Admitting: Anesthesiology

## 2014-08-31 ENCOUNTER — Encounter (HOSPITAL_COMMUNITY): Payer: Self-pay | Admitting: Anesthesiology

## 2014-08-31 ENCOUNTER — Ambulatory Visit (HOSPITAL_COMMUNITY)
Admission: RE | Admit: 2014-08-31 | Discharge: 2014-08-31 | Disposition: A | Discharge status: Home / Self Care | Payer: BC Managed Care – PPO | Source: Ambulatory Visit | Attending: Surgery | Admitting: Surgery

## 2014-08-31 DIAGNOSIS — K8010 Calculus of gallbladder with chronic cholecystitis without obstruction: Secondary | ICD-10-CM | POA: Diagnosis not present

## 2014-08-31 DIAGNOSIS — F419 Anxiety disorder, unspecified: Secondary | ICD-10-CM | POA: Diagnosis not present

## 2014-08-31 DIAGNOSIS — K219 Gastro-esophageal reflux disease without esophagitis: Secondary | ICD-10-CM | POA: Diagnosis not present

## 2014-08-31 DIAGNOSIS — J45909 Unspecified asthma, uncomplicated: Secondary | ICD-10-CM | POA: Insufficient documentation

## 2014-08-31 HISTORY — DX: Other specified postprocedural states: Z98.890

## 2014-08-31 HISTORY — DX: Adverse effect of unspecified anesthetic, initial encounter: T41.45XA

## 2014-08-31 HISTORY — PX: CHOLECYSTECTOMY: SHX55

## 2014-08-31 HISTORY — DX: Anxiety disorder, unspecified: F41.9

## 2014-08-31 HISTORY — DX: Headache: R51

## 2014-08-31 HISTORY — DX: Other complications of anesthesia, initial encounter: T88.59XA

## 2014-08-31 HISTORY — DX: Nausea with vomiting, unspecified: R11.2

## 2014-08-31 HISTORY — DX: Personal history of urinary (tract) infections: Z87.440

## 2014-08-31 HISTORY — DX: Pneumonia, unspecified organism: J18.9

## 2014-08-31 HISTORY — DX: Headache, unspecified: R51.9

## 2014-08-31 LAB — CBC
HCT: 40.4 % (ref 36.0–46.0)
Hemoglobin: 13.8 g/dL (ref 12.0–15.0)
MCH: 31.5 pg (ref 26.0–34.0)
MCHC: 34.2 g/dL (ref 30.0–36.0)
MCV: 92.2 fL (ref 78.0–100.0)
Platelets: 294 10*3/uL (ref 150–400)
RBC: 4.38 MIL/uL (ref 3.87–5.11)
RDW: 13.1 % (ref 11.5–15.5)
WBC: 7.8 10*3/uL (ref 4.0–10.5)

## 2014-08-31 LAB — HCG, SERUM, QUALITATIVE: Preg, Serum: NEGATIVE

## 2014-08-31 SURGERY — LAPAROSCOPIC CHOLECYSTECTOMY
Anesthesia: General | Site: Abdomen

## 2014-08-31 MED ORDER — MEPERIDINE HCL 25 MG/ML IJ SOLN: 6.2500 mg | INTRAMUSCULAR | Status: DC | PRN

## 2014-08-31 MED ORDER — NEOSTIGMINE METHYLSULFATE 10 MG/10ML IV SOLN
INTRAVENOUS | Status: AC
  Filled 2014-08-31: qty 1

## 2014-08-31 MED ORDER — SODIUM CHLORIDE 0.9 % IV SOLN: 250.0000 mL | INTRAVENOUS | Status: DC | PRN

## 2014-08-31 MED ORDER — ALBUTEROL SULFATE HFA 108 (90 BASE) MCG/ACT IN AERS
INHALATION_SPRAY | RESPIRATORY_TRACT | Status: DC | PRN
  Administered 2014-08-31: 3 via RESPIRATORY_TRACT

## 2014-08-31 MED ORDER — ONDANSETRON HCL 4 MG/2ML IJ SOLN
INTRAMUSCULAR | Status: AC
  Filled 2014-08-31: qty 2

## 2014-08-31 MED ORDER — OXYCODONE HCL 5 MG PO TABS: 5.0000 mg | ORAL_TABLET | ORAL | Status: DC | PRN

## 2014-08-31 MED ORDER — PROMETHAZINE HCL 25 MG/ML IJ SOLN
6.2500 mg | INTRAMUSCULAR | Status: AC | PRN
  Administered 2014-08-31 (×2): 6.25 mg via INTRAVENOUS

## 2014-08-31 MED ORDER — ACETAMINOPHEN 650 MG RE SUPP
650.0000 mg | RECTAL | Status: DC | PRN
  Filled 2014-08-31: qty 1

## 2014-08-31 MED ORDER — FENTANYL CITRATE 0.05 MG/ML IJ SOLN
INTRAMUSCULAR | Status: DC | PRN
  Administered 2014-08-31 (×3): 50 ug via INTRAVENOUS

## 2014-08-31 MED ORDER — FENTANYL CITRATE 0.05 MG/ML IJ SOLN
INTRAMUSCULAR | Status: AC
  Filled 2014-08-31: qty 2

## 2014-08-31 MED ORDER — ACETAMINOPHEN 325 MG PO TABS
650.0000 mg | ORAL_TABLET | ORAL | Status: DC | PRN
  Filled 2014-08-31: qty 2

## 2014-08-31 MED ORDER — ROCURONIUM BROMIDE 100 MG/10ML IV SOLN
INTRAVENOUS | Status: DC | PRN
  Administered 2014-08-31: 10 mg via INTRAVENOUS

## 2014-08-31 MED ORDER — MORPHINE SULFATE 2 MG/ML IJ SOLN: 1.0000 mg | INTRAMUSCULAR | Status: DC | PRN

## 2014-08-31 MED ORDER — LIDOCAINE HCL (CARDIAC) 20 MG/ML IV SOLN
INTRAVENOUS | Status: DC | PRN
  Administered 2014-08-31: 100 mg via INTRAVENOUS

## 2014-08-31 MED ORDER — HYDROCODONE-ACETAMINOPHEN 5-325 MG PO TABS: 1.0000 | ORAL_TABLET | ORAL | Status: DC | PRN

## 2014-08-31 MED ORDER — BUPIVACAINE-EPINEPHRINE 0.25% -1:200000 IJ SOLN
INTRAMUSCULAR | Status: DC | PRN
  Administered 2014-08-31: 20 mL

## 2014-08-31 MED ORDER — SODIUM CHLORIDE 0.9 % IJ SOLN: 3.0000 mL | INTRAMUSCULAR | Status: DC | PRN

## 2014-08-31 MED ORDER — ONDANSETRON HCL 4 MG/2ML IJ SOLN
INTRAMUSCULAR | Status: DC | PRN
  Administered 2014-08-31: 4 mg via INTRAVENOUS

## 2014-08-31 MED ORDER — SODIUM CHLORIDE 0.9 % IJ SOLN: 3.0000 mL | Freq: Two times a day (BID) | INTRAMUSCULAR | Status: DC

## 2014-08-31 MED ORDER — PROPOFOL 10 MG/ML IV BOLUS
INTRAVENOUS | Status: DC | PRN
  Administered 2014-08-31: 180 mg via INTRAVENOUS

## 2014-08-31 MED ORDER — FENTANYL CITRATE 0.05 MG/ML IJ SOLN
25.0000 ug | INTRAMUSCULAR | Status: DC | PRN
  Administered 2014-08-31: 50 ug via INTRAVENOUS
  Administered 2014-08-31 (×4): 25 ug via INTRAVENOUS

## 2014-08-31 MED ORDER — GLYCOPYRROLATE 0.2 MG/ML IJ SOLN
INTRAMUSCULAR | Status: DC | PRN
  Administered 2014-08-31: 0.4 mg via INTRAVENOUS

## 2014-08-31 MED ORDER — SCOPOLAMINE 1 MG/3DAYS TD PT72
1.0000 | MEDICATED_PATCH | TRANSDERMAL | Status: DC
  Administered 2014-08-31: 1.5 mg via TRANSDERMAL
  Filled 2014-08-31: qty 1

## 2014-08-31 MED ORDER — PROMETHAZINE HCL 25 MG/ML IJ SOLN
INTRAMUSCULAR | Status: AC
  Administered 2014-08-31: 6.25 mg via INTRAVENOUS
  Filled 2014-08-31: qty 1

## 2014-08-31 MED ORDER — MIDAZOLAM HCL 2 MG/2ML IJ SOLN
INTRAMUSCULAR | Status: AC
  Filled 2014-08-31: qty 2

## 2014-08-31 MED ORDER — LIDOCAINE HCL (CARDIAC) 20 MG/ML IV SOLN
INTRAVENOUS | Status: AC
  Filled 2014-08-31: qty 5

## 2014-08-31 MED ORDER — NEOSTIGMINE METHYLSULFATE 10 MG/10ML IV SOLN
INTRAVENOUS | Status: DC | PRN
  Administered 2014-08-31: 2 mg via INTRAVENOUS

## 2014-08-31 MED ORDER — 0.9 % SODIUM CHLORIDE (POUR BTL) OPTIME
TOPICAL | Status: DC | PRN
  Administered 2014-08-31: 1000 mL

## 2014-08-31 MED ORDER — PROPOFOL 10 MG/ML IV BOLUS
INTRAVENOUS | Status: AC
  Filled 2014-08-31: qty 20

## 2014-08-31 MED ORDER — BUPIVACAINE-EPINEPHRINE (PF) 0.25% -1:200000 IJ SOLN
INTRAMUSCULAR | Status: AC
  Filled 2014-08-31: qty 30

## 2014-08-31 MED ORDER — LACTATED RINGERS IV SOLN
INTRAVENOUS | Status: DC
  Administered 2014-08-31: 08:00:00 via INTRAVENOUS

## 2014-08-31 MED ORDER — FENTANYL CITRATE 0.05 MG/ML IJ SOLN
INTRAMUSCULAR | Status: AC
  Filled 2014-08-31: qty 5

## 2014-08-31 MED ORDER — MIDAZOLAM HCL 5 MG/5ML IJ SOLN
INTRAMUSCULAR | Status: DC | PRN
  Administered 2014-08-31: 2 mg via INTRAVENOUS

## 2014-08-31 MED ORDER — SUCCINYLCHOLINE CHLORIDE 20 MG/ML IJ SOLN
INTRAMUSCULAR | Status: DC | PRN
  Administered 2014-08-31: 100 mg via INTRAVENOUS

## 2014-08-31 MED ORDER — PROMETHAZINE HCL 25 MG/ML IJ SOLN
INTRAMUSCULAR | Status: AC
  Filled 2014-08-31: qty 1

## 2014-08-31 MED ORDER — GLYCOPYRROLATE 0.2 MG/ML IJ SOLN
INTRAMUSCULAR | Status: AC
  Filled 2014-08-31: qty 2

## 2014-08-31 MED ORDER — SODIUM CHLORIDE 0.9 % IR SOLN
Status: DC | PRN
Start: 2014-08-31 — End: 2014-08-31
  Administered 2014-08-31: 1000 mL

## 2014-08-31 SURGICAL SUPPLY — 41 items
APPLIER CLIP 5 13 M/L LIGAMAX5 (MISCELLANEOUS) ×3
CANISTER SUCTION 2500CC (MISCELLANEOUS) ×3 IMPLANT
CHLORAPREP W/TINT 26ML (MISCELLANEOUS) ×3 IMPLANT
CLIP APPLIE 5 13 M/L LIGAMAX5 (MISCELLANEOUS) ×1 IMPLANT
COVER MAYO STAND STRL (DRAPES) IMPLANT
COVER SURGICAL LIGHT HANDLE (MISCELLANEOUS) ×3 IMPLANT
DECANTER SPIKE VIAL GLASS SM (MISCELLANEOUS) ×3 IMPLANT
DRAPE C-ARM 42X72 X-RAY (DRAPES) IMPLANT
DRAPE LAPAROSCOPIC ABDOMINAL (DRAPES) ×3 IMPLANT
ELECT REM PT RETURN 9FT ADLT (ELECTROSURGICAL) ×3
ELECTRODE REM PT RTRN 9FT ADLT (ELECTROSURGICAL) ×1 IMPLANT
GLOVE BIOGEL PI IND STRL 6.5 (GLOVE) ×1 IMPLANT
GLOVE BIOGEL PI IND STRL 7.0 (GLOVE) ×1 IMPLANT
GLOVE BIOGEL PI INDICATOR 6.5 (GLOVE) ×2
GLOVE BIOGEL PI INDICATOR 7.0 (GLOVE) ×2
GLOVE SKINSENSE NS SZ7.0 (GLOVE) ×2
GLOVE SKINSENSE STRL SZ7.0 (GLOVE) ×1 IMPLANT
GLOVE SURG SIGNA 7.5 PF LTX (GLOVE) ×3 IMPLANT
GOWN STRL REUS W/ TWL LRG LVL3 (GOWN DISPOSABLE) ×3 IMPLANT
GOWN STRL REUS W/ TWL XL LVL3 (GOWN DISPOSABLE) ×1 IMPLANT
GOWN STRL REUS W/TWL LRG LVL3 (GOWN DISPOSABLE) ×6
GOWN STRL REUS W/TWL XL LVL3 (GOWN DISPOSABLE) ×2
KIT BASIN OR (CUSTOM PROCEDURE TRAY) ×3 IMPLANT
KIT ROOM TURNOVER OR (KITS) ×3 IMPLANT
LIQUID BAND (GAUZE/BANDAGES/DRESSINGS) ×3 IMPLANT
NS IRRIG 1000ML POUR BTL (IV SOLUTION) ×3 IMPLANT
PAD ARMBOARD 7.5X6 YLW CONV (MISCELLANEOUS) ×3 IMPLANT
POUCH SPECIMEN RETRIEVAL 10MM (ENDOMECHANICALS) IMPLANT
SCISSORS LAP 5X35 DISP (ENDOMECHANICALS) ×3 IMPLANT
SET CHOLANGIOGRAPH 5 50 .035 (SET/KITS/TRAYS/PACK) IMPLANT
SET IRRIG TUBING LAPAROSCOPIC (IRRIGATION / IRRIGATOR) ×3 IMPLANT
SLEEVE ENDOPATH XCEL 5M (ENDOMECHANICALS) ×6 IMPLANT
SPECIMEN JAR SMALL (MISCELLANEOUS) ×3 IMPLANT
SUT MON AB 4-0 PC3 18 (SUTURE) ×3 IMPLANT
TOWEL OR 17X24 6PK STRL BLUE (TOWEL DISPOSABLE) ×3 IMPLANT
TOWEL OR 17X26 10 PK STRL BLUE (TOWEL DISPOSABLE) ×3 IMPLANT
TRAY LAPAROSCOPIC (CUSTOM PROCEDURE TRAY) ×3 IMPLANT
TROCAR XCEL BLUNT TIP 100MML (ENDOMECHANICALS) ×3 IMPLANT
TROCAR XCEL NON-BLD 5MMX100MML (ENDOMECHANICALS) ×3 IMPLANT
TUBING INSUFFLATION (TUBING) ×3 IMPLANT
WATER STERILE IRR 1000ML POUR (IV SOLUTION) IMPLANT

## 2014-08-31 NOTE — Anesthesia Procedure Notes (Signed)
Procedure Name: Intubation Date/Time: 08/31/2014 9:05 AM Performed by: Sharlene DoryWALKER, Dayla Gasca E Pre-anesthesia Checklist: Patient identified, Emergency Drugs available, Suction available, Patient being monitored and Timeout performed Patient Re-evaluated:Patient Re-evaluated prior to inductionOxygen Delivery Method: Circle system utilized Preoxygenation: Pre-oxygenation with 100% oxygen Intubation Type: IV induction Ventilation: Mask ventilation without difficulty Laryngoscope Size: Mac and 3 Grade View: Grade III Tube type: Oral Tube size: 7.0 mm Number of attempts: 1 Airway Equipment and Method: Stylet Placement Confirmation: positive ETCO2 and breath sounds checked- equal and bilateral Secured at: 21 cm Tube secured with: Tape Dental Injury: Teeth and Oropharynx as per pre-operative assessment

## 2014-08-31 NOTE — Interval H&P Note (Signed)
History and Physical Interval Note: no change in H and P  08/31/2014 7:52 AM  Jane Mclaughlin  has presented today for surgery, with the diagnosis of Symptomatic Cholelithiasis  The various methods of treatment have been discussed with the patient and family. After consideration of risks, benefits and other options for treatment, the patient has consented to  Procedure(s): LAPAROSCOPIC CHOLECYSTECTOMY (N/A) as a surgical intervention .  The patient's history has been reviewed, patient examined, no change in status, stable for surgery.  I have reviewed the patient's chart and labs.  Questions were answered to the patient's satisfaction.     Baby Gieger A

## 2014-08-31 NOTE — Op Note (Signed)
Laparoscopic Cholecystectomy Procedure Note  Indications: This patient presents with symptomatic gallbladder disease and will undergo laparoscopic cholecystectomy.  Pre-operative Diagnosis: Calculus of gallbladder with other cholecystitis, without mention of obstruction  Post-operative Diagnosis: Same  Surgeon: Abigail MiyamotoBLACKMAN,Camesha Farooq A   Assistants: 0  Anesthesia: General endotracheal anesthesia  ASA Class: 2  Procedure Details  The patient was seen again in the Holding Room. The risks, benefits, complications, treatment options, and expected outcomes were discussed with the patient. The possibilities of reaction to medication, pulmonary aspiration, perforation of viscus, bleeding, recurrent infection, finding a normal gallbladder, the need for additional procedures, failure to diagnose a condition, the possible need to convert to an open procedure, and creating a complication requiring transfusion or operation were discussed with the patient. The likelihood of improving the patient's symptoms with return to their baseline status is good.  The patient and/or family concurred with the proposed plan, giving informed consent. The site of surgery properly noted. The patient was taken to Operating Room, identified as Jane Mclaughlin and the procedure verified as Laparoscopic Cholecystectomy with Intraoperative Cholangiogram. A Time Out was held and the above information confirmed.  Prior to the induction of general anesthesia, antibiotic prophylaxis was administered. General endotracheal anesthesia was then administered and tolerated well. After the induction, the abdomen was prepped with Chloraprep and draped in sterile fashion. The patient was positioned in the supine position.  Local anesthetic agent was injected into the skin near the umbilicus and an incision made. We dissected down to the abdominal fascia with blunt dissection.  The fascia was incised vertically and we entered the peritoneal cavity  bluntly.  A pursestring suture of 0-Vicryl was placed around the fascial opening.  The Hasson cannula was inserted and secured with the stay suture.  Pneumoperitoneum was then created with CO2 and tolerated well without any adverse changes in the patient's vital signs. A 5-mm port was placed in the subxiphoid position.  Two 5-mm ports were placed in the right upper quadrant. All skin incisions were infiltrated with a local anesthetic agent before making the incision and placing the trocars.   We positioned the patient in reverse Trendelenburg, tilted slightly to the patient's left.  The gallbladder was identified, the fundus grasped and retracted cephalad. Adhesions were lysed bluntly and with the electrocautery where indicated, taking care not to injure any adjacent organs or viscus. The infundibulum was grasped and retracted laterally, exposing the peritoneum overlying the triangle of Calot. This was then divided and exposed in a blunt fashion. The cystic duct was clearly identified and bluntly dissected circumferentially. A critical view of the cystic duct and cystic artery was obtained.  The cystic duct was then ligated with clips and divided. The cystic artery was, dissected free, ligated with clips and divided as well.   The gallbladder was dissected from the liver bed in retrograde fashion with the electrocautery. The gallbladder was removed and placed in an Endocatch sac. The liver bed was irrigated and inspected. Hemostasis was achieved with the electrocautery. Copious irrigation was utilized and was repeatedly aspirated until clear.  The gallbladder and Endocatch sac were then removed through the umbilical port site.  The pursestring suture was used to close the umbilical fascia.    We again inspected the right upper quadrant for hemostasis.  Pneumoperitoneum was released as we removed the trocars.  4-0 Monocryl was used to close the skin.   Benzoin, steri-strips, and clean dressings were applied.  The patient was then extubated and brought to the recovery room  in stable condition. Instrument, sponge, and needle counts were correct at closure and at the conclusion of the case.   Findings: Chronic Cholecystitis with Cholelithiasis  Estimated Blood Loss: Minimal         Drains: 0         Specimens: Gallbladder           Complications: None; patient tolerated the procedure well.         Disposition: PACU - hemodynamically stable.         Condition: stable

## 2014-08-31 NOTE — Anesthesia Postprocedure Evaluation (Signed)
  Anesthesia Post-op Note  Patient: Jane Mclaughlin  Procedure(s) Performed: Procedure(s): LAPAROSCOPIC CHOLECYSTECTOMY (N/A)  Patient Location: PACU  Anesthesia Type:General  Level of Consciousness: awake and alert   Airway and Oxygen Therapy: Patient Spontanous Breathing  Post-op Pain: mild  Post-op Assessment: Post-op Vital signs reviewed and Patient's Cardiovascular Status Stable  Post-op Vital Signs: Reviewed and stable  Last Vitals:  Filed Vitals:   08/31/14 0748  BP: 118/65  Pulse: 71  Temp: 37.3 C    Complications: No apparent anesthesia complications

## 2014-08-31 NOTE — Transfer of Care (Signed)
Immediate Anesthesia Transfer of Care Note  Patient: Jane Mclaughlin  Procedure(s) Performed: Procedure(s): LAPAROSCOPIC CHOLECYSTECTOMY (N/A)  Patient Location: PACU  Anesthesia Type:General  Level of Consciousness: awake, alert  and oriented  Airway & Oxygen Therapy: Patient Spontanous Breathing and Patient connected to face mask oxygen  Post-op Assessment: Report given to PACU RN, Post -op Vital signs reviewed and stable and Patient moving all extremities  Post vital signs: Reviewed and stable  Complications: No apparent anesthesia complications

## 2014-08-31 NOTE — Progress Notes (Signed)
report given to angel britt rn as caregiver

## 2014-08-31 NOTE — Discharge Instructions (Signed)
[facility] ______[facility] LAPAROSCOPIC SURGERY: POST OP INSTRUCTIONS Always review your discharge instruction sheet given to you by the facility where your surgery was performed. IF YOU HAVE DISABILITY OR FAMILY LEAVE FORMS, YOU MUST BRING THEM TO THE OFFICE FOR PROCESSING.   DO NOT GIVE THEM TO YOUR DOCTOR.  1. A prescription for pain medication may be given to you upon discharge.  Take your pain medication as prescribed, if needed.  If narcotic pain medicine is not needed, then you may take acetaminophen (Tylenol) or ibuprofen (Advil) as needed. 2. Take your usually prescribed medications unless otherwise directed. 3. If you need a refill on your pain medication, please contact your pharmacy.  They will contact our office to request authorization. Prescriptions will not be filled after 5pm or on week-ends. 4. You should follow a light diet the first few days after arrival home, such as soup and crackers, etc.  Be sure to include lots of fluids daily. 5. Most patients will experience some swelling and bruising in the area of the incisions.  Ice packs will help.  Swelling and bruising can take several days to resolve.  6. It is common to experience some constipation if taking pain medication after surgery.  Increasing fluid intake and taking a stool softener (such as Colace) will usually help or prevent this problem from occurring.  A mild laxative (Milk of Magnesia or Miralax) should be taken according to package instructions if there are no bowel movements after 48 hours. 7. Unless discharge instructions indicate otherwise, you may remove your bandages 24-48 hours after surgery, and you may shower at that time.  You may have steri-strips (small skin tapes) in place directly over the incision.  These strips should be left on the skin for 7-10 days.  If your surgeon used skin glue on the incision, you may shower in 24 hours.  The glue will flake off over the next 2-3 weeks.  Any sutures or  staples will be removed at the office during your follow-up visit. 8. ACTIVITIES:  You may resume regular (light) daily activities beginning the next day--such as daily self-care, walking, climbing stairs--gradually increasing activities as tolerated.  You may have sexual intercourse when it is comfortable.  Refrain from any heavy lifting or straining until approved by your doctor. a. You may drive when you are no longer taking prescription pain medication, you can comfortably wear a seatbelt, and you can safely maneuver your car and apply brakes. b. RETURN TO WORK:  __________________________________________________________ 9. You should see your doctor in the office for a follow-up appointment approximately 2-3 weeks after your surgery.  Make sure that you call for this appointment within a day or two after you arrive home to insure a convenient appointment time. 10. OTHER INSTRUCTIONS: _NO LIFTING MORE THAN 15 POUNDS FOR 2 WEEKS. 11. ICE PACK ALSO FOR PAIN_________________________________________________________________________________________________________________________ __________________________________________________________________________________________________________________________ WHEN TO CALL YOUR DOCTOR: 1. Fever over 101.0 2. Inability to urinate 3. Continued bleeding from incision. 4. Increased pain, redness, or drainage from the incision. 5. Increasing abdominal pain  The clinic staff is available to answer your questions during regular business hours.  Please dont hesitate to call and ask to speak to one of the nurses for clinical concerns.  If you have a medical emergency, go to the nearest emergency room or call 911.  A surgeon from [facility] is always on call at the hospital. [address], [address], [place], [place]  [id number] ? [phone], [place],  [place]   [address] [phone] ? [phone] ? FAX [phone] Web site:  [email or web address]  General Anesthesia, Adult, Care After  Refer to this sheet in the next few weeks. These instructions provide you with information on caring for yourself after your procedure. Your health care provider may also give you more specific instructions. Your treatment has been planned according to current medical practices, but problems sometimes occur. Call your health care provider if you have any problems or questions after your procedure.  WHAT TO EXPECT AFTER THE PROCEDURE  After the procedure, it is typical to experience:  Sleepiness.  Nausea and vomiting. HOME CARE INSTRUCTIONS  For the first 24 hours after general anesthesia:  Have a responsible person with you.  Do not drive a car. If you are alone, do not take public transportation.  Do not drink alcohol.  Do not take medicine that has not been prescribed by your health care provider.  Do not sign important papers or make important decisions.  You may resume a normal diet and activities as directed by your health care provider.  Change bandages (dressings) as directed.  If you have questions or problems that seem related to general anesthesia, call the hospital and ask for the anesthetist or anesthesiologist on call. SEEK MEDICAL CARE IF:  You have nausea and vomiting that continue the day after anesthesia.  You develop a rash. SEEK IMMEDIATE MEDICAL CARE IF:  You have difficulty breathing.  You have chest pain.  You have any allergic problems. Document Released: 10/29/2000 Document Revised: 03/25/2013 Document Reviewed: 02/05/2013  [facility] Patient Information 2014 [place], [place].

## 2014-08-31 NOTE — Progress Notes (Signed)
Receiving report from Shirley RN 

## 2014-09-02 ENCOUNTER — Encounter (HOSPITAL_COMMUNITY): Payer: Self-pay | Admitting: Surgery

## 2015-03-07 LAB — HM PAP SMEAR: HM PAP: NORMAL

## 2016-01-27 ENCOUNTER — Telehealth: Payer: Self-pay | Admitting: *Deleted

## 2016-01-27 NOTE — Telephone Encounter (Signed)
Unable to reach patient at time of Pre-Visit Call.  Left message for patient to return call when available.    

## 2016-01-30 ENCOUNTER — Encounter: Payer: Self-pay | Admitting: Family

## 2016-01-30 ENCOUNTER — Ambulatory Visit (INDEPENDENT_AMBULATORY_CARE_PROVIDER_SITE_OTHER): Payer: BC Managed Care – PPO | Admitting: Family

## 2016-01-30 VITALS — BP 114/84 | HR 87 | Temp 98.4°F | Resp 18 | Ht 63.5 in | Wt 252.0 lb

## 2016-01-30 DIAGNOSIS — J45990 Exercise induced bronchospasm: Secondary | ICD-10-CM | POA: Insufficient documentation

## 2016-01-30 DIAGNOSIS — F419 Anxiety disorder, unspecified: Secondary | ICD-10-CM | POA: Diagnosis not present

## 2016-01-30 DIAGNOSIS — G44229 Chronic tension-type headache, not intractable: Secondary | ICD-10-CM | POA: Diagnosis not present

## 2016-01-30 NOTE — Patient Instructions (Signed)
Please contact [facility] and schedule an appointment with one of our therapists here [facility] to discuss anxiety. Please schedule a complete physical at the front desk.

## 2016-01-30 NOTE — Progress Notes (Signed)
Subjective:    Patient ID: Jane Mclaughlin, female    DOB: 09/21/1975, 40 y.o.   MRN: [id number]  HPI  [name] is a 40 yr old female who presents today to establish care.    Reports that she developed anxiety in the post partum period following the birth of her son. Was on zoloft for several years.  Son is now 8510. She took again for a few months after her daughter was born. Patient was having panic attacks at night and not sleeping well during the school year (especially at the end of the school year). 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 She is feeling much better now.  Dad has untreated anxiety.    Tension Headaches- reports that she gets 2 x a week behind her eyes. Reports worse in fall in spring when she has allergies.  Excedrin "tension HA" resolves her symptoms. Has not had in a week or two.   Allergy induced asthma- uses rarely.  (albuterol).   Review of Systems    see HPI  Past Medical History  Diagnosis Date  . Allergy   . Scoliosis   . No pertinent past medical history   . Asthma   . PP care - s/p NVB 4/30 12/05/2011  . Pneumonia   . Anxiety     not taking anything at present time  . Hx: UTI (urinary tract infection)   . Headache     tension headaches  . Complication of anesthesia     woke up itching after tonsillectomy  . PONV (postoperative nausea and vomiting)     after tonsillectomy     Social History   Social History  . Marital Status: Married    Spouse Name: N/A  . Number of Children: N/A  . Years of Education: N/A   Occupational History  . Not on file.   Social History Main Topics  . Smoking status: Never Smoker   . Smokeless tobacco: Never Used  . Alcohol Use: 0.0 oz/week    0 Standard drinks or equivalent per week     Comment: social - occasional  . Drug Use: No  . Sexual Activity: Yes    Birth Control/ Protection: None   Other Topics Concern  . Not on file   Social History Narrative    Past Surgical History  Procedure  Laterality Date  . Tonsillectomy      and adenoidectomy  . Wisdom tooth extraction    . Cholecystectomy N/A 08/31/2014    Procedure: LAPAROSCOPIC CHOLECYSTECTOMY;  Surgeon: Abigail Miyamotoouglas Blackman, MD;  [facility];  Service: General;  Laterality: N/A;    Family History  Problem Relation Age of Onset  . Hypertension Mother   . Asthma Mother   . Other Mother     "pre-skin cancer"  . Heart attack Neg Hx   . Diabetes Neg Hx   . Anesthesia problems Neg Hx   . Kidney cancer Father   . Skin cancer Father   . Cancer Paternal Grandfather     colon    Allergies  Allergen Reactions  . Aspirin Anaphylaxis  . Nsaids Shortness Of Breath and Swelling  . Other Rash    TREE NUTS  . Penicillins Hives    Current Outpatient Prescriptions on File Prior to Visit  Medication Sig Dispense Refill  . Multiple Vitamin (MULTIVITAMIN WITH MINERALS) TABS tablet Take 1 tablet by mouth daily. Reported on 01/30/2016     No current facility-administered medications on  file prior to visit.    BP 114/84 mmHg  Pulse 87  Temp(Src) 98.4 F (36.9 C) (Oral)  Resp 18  Ht 5' 3.5" (1.613 m)  Wt 252 lb (114.306 kg)  BMI 43.93 kg/m2  SpO2 98%    Objective:   Physical Exam  Constitutional: She is oriented to person, place, and time. She appears well-developed and well-nourished. No distress.  HENT:  Head: Normocephalic and atraumatic.  Cardiovascular: Normal rate and regular rhythm.   No murmur heard. Pulmonary/Chest: Effort normal and breath sounds normal. No respiratory distress. She has no wheezes. She has no rales.  Musculoskeletal: She exhibits no edema.  Neurological: She is alert and oriented to person, place, and time.  Psychiatric: She has a normal mood and affect. Her behavior is normal. Judgment and thought content normal.          Assessment & Plan:

## 2016-01-30 NOTE — Assessment & Plan Note (Signed)
She hopes to avoid medication. Suggested that she contact [facility] to establish with one of our therapists for counseling. Also suggested that she try to add regular exercise to help with stress/anxiety.

## 2016-01-30 NOTE — Assessment & Plan Note (Signed)
Stable with prn use of excedrin, seems improved per pt since she is out of school for the summer. Monitor.

## 2016-01-30 NOTE — Progress Notes (Signed)
Pre visit review using our clinic review tool, if applicable. No additional management support is needed unless otherwise documented below in the visit note. 

## 2016-01-30 NOTE — Assessment & Plan Note (Signed)
Stable with occasional use of prn albuterol.

## 2016-02-16 ENCOUNTER — Ambulatory Visit (INDEPENDENT_AMBULATORY_CARE_PROVIDER_SITE_OTHER): Payer: BC Managed Care – PPO | Admitting: Family

## 2016-02-16 ENCOUNTER — Encounter: Payer: Self-pay | Admitting: Family

## 2016-02-16 VITALS — BP 126/86 | HR 82 | Temp 98.0°F | Resp 16 | Ht 64.5 in | Wt 255.8 lb

## 2016-02-16 DIAGNOSIS — Z0000 Encounter for general adult medical examination without abnormal findings: Secondary | ICD-10-CM | POA: Diagnosis not present

## 2016-02-16 LAB — HEPATIC FUNCTION PANEL
ALT: 26 U/L (ref 0–35)
AST: 16 U/L (ref 0–37)
Albumin: 4 g/dL (ref 3.5–5.2)
Alkaline Phosphatase: 60 U/L (ref 39–117)
BILIRUBIN DIRECT: 0.1 mg/dL (ref 0.0–0.3)
BILIRUBIN TOTAL: 0.5 mg/dL (ref 0.2–1.2)
Total Protein: 7.1 g/dL (ref 6.0–8.3)

## 2016-02-16 LAB — URINALYSIS, ROUTINE W REFLEX MICROSCOPIC
Bilirubin Urine: NEGATIVE
Hgb urine dipstick: NEGATIVE
Ketones, ur: NEGATIVE
Leukocytes, UA: NEGATIVE
Nitrite: NEGATIVE
PH: 6.5 (ref 5.0–8.0)
Total Protein, Urine: NEGATIVE
URINE GLUCOSE: NEGATIVE
Urobilinogen, UA: 0.2 (ref 0.0–1.0)

## 2016-02-16 LAB — CBC WITH DIFFERENTIAL/PLATELET
BASOS PCT: 0.5 % (ref 0.0–3.0)
Basophils Absolute: 0 10*3/uL (ref 0.0–0.1)
EOS ABS: 0.3 10*3/uL (ref 0.0–0.7)
EOS PCT: 3.4 % (ref 0.0–5.0)
HEMATOCRIT: 42.7 % (ref 36.0–46.0)
HEMOGLOBIN: 14.5 g/dL (ref 12.0–15.0)
Lymphocytes Relative: 35.3 % (ref 12.0–46.0)
Lymphs Abs: 2.7 10*3/uL (ref 0.7–4.0)
MCHC: 34 g/dL (ref 30.0–36.0)
MCV: 91.3 fl (ref 78.0–100.0)
Monocytes Absolute: 0.4 10*3/uL (ref 0.1–1.0)
Monocytes Relative: 5.8 % (ref 3.0–12.0)
Neutro Abs: 4.2 10*3/uL (ref 1.4–7.7)
Neutrophils Relative %: 55 % (ref 43.0–77.0)
Platelets: 295 10*3/uL (ref 150.0–400.0)
RBC: 4.68 Mil/uL (ref 3.87–5.11)
RDW: 13.5 % (ref 11.5–15.5)
WBC: 7.6 10*3/uL (ref 4.0–10.5)

## 2016-02-16 LAB — BASIC METABOLIC PANEL
BUN: 9 mg/dL (ref 6–23)
CHLORIDE: 107 meq/L (ref 96–112)
CO2: 28 mEq/L (ref 19–32)
Calcium: 9.3 mg/dL (ref 8.4–10.5)
Creatinine, Ser: 0.61 mg/dL (ref 0.40–1.20)
GFR: 116.4 mL/min (ref >60.00)
Glucose, Bld: 89 mg/dL (ref 70–99)
POTASSIUM: 4 meq/L (ref 3.5–5.1)
Sodium: 139 mEq/L (ref 135–145)

## 2016-02-16 LAB — LIPID PANEL
CHOL/HDL RATIO: 3
Cholesterol: 134 mg/dL (ref 0–200)
HDL: 40.6 mg/dL (ref >39.00)
LDL CALC: 80 mg/dL (ref 0–99)
NonHDL: 93.67
Triglycerides: 68 mg/dL (ref 0.0–149.0)
VLDL: 13.6 mg/dL (ref 0.0–40.0)

## 2016-02-16 LAB — TSH: TSH: 2.22 u[IU]/mL (ref 0.35–4.50)

## 2016-02-16 NOTE — Progress Notes (Signed)
Pre visit review using our clinic review tool, if applicable. No additional management support is needed unless otherwise documented below in the visit note. 

## 2016-02-16 NOTE — Progress Notes (Addendum)
Subjective:    Patient ID: Jane LipsSamantha Mclaughlin, female    DOB: 03/28/1976, 40 y.o.   MRN: [id number]  HPI  Patient presents today for complete physical.  Immunizations: tetanus up to date Diet: healthy Exercise: has been more active this summer, hikes, walks, swims Pap Smear: 03/07/15 (GYN) Dental: up to date Vision: Due Wt Readings from Last 3 Encounters:  02/16/16 255 lb 12.8 oz (116.03 kg)  01/30/16 252 lb (114.306 kg)  08/31/14 267 lb (121.11 kg)     Review of Systems  Constitutional: Negative for unexpected weight change.  HENT: Negative for hearing loss and rhinorrhea.   Eyes: Negative for visual disturbance.  Respiratory: Negative for cough.   Cardiovascular: Negative for leg swelling.  Gastrointestinal: Negative for diarrhea and constipation.  Genitourinary: Negative for dysuria and frequency.  Musculoskeletal: Negative for myalgias.       Left ankle hurting today- no injury  Skin: Negative for rash.  Neurological:       Some HA's which she attributes to caffeine withdrawl  Hematological: Negative for adenopathy.  Psychiatric/Behavioral:       Denies depression/axiety       Past Medical History  Diagnosis Date  . Allergy   . Scoliosis   . No pertinent past medical history   . Asthma   . PP care - s/p NVB 4/30 12/05/2011  . Pneumonia   . Anxiety     not taking anything at present time  . Hx: UTI (urinary tract infection)   . Headache     tension headaches  . Complication of anesthesia     woke up itching after tonsillectomy  . PONV (postoperative nausea and vomiting)     after tonsillectomy     Social History   Social History  . Marital Status: Married    Spouse Name: N/A  . Number of Children: N/A  . Years of Education: N/A   Occupational History  . Not on file.   Social History Main Topics  . Smoking status: Never Smoker   . Smokeless tobacco: Never Used  . Alcohol Use: 0.0 oz/week    0 Standard drinks or equivalent per week     Comment:  social - occasional  . Drug Use: No  . Sexual Activity: Yes    Birth Control/ Protection: IUD   Other Topics Concern  . 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 Spends a lot of time outside   Enjoys reading    Past Surgical History  Procedure Laterality Date  . Tonsillectomy      and adenoidectomy  . Wisdom tooth extraction    . Cholecystectomy N/A 08/31/2014    Procedure: LAPAROSCOPIC CHOLECYSTECTOMY;  Surgeon: Abigail Miyamotoouglas Blackman, MD;  [facility];  Service: General;  Laterality: N/A;    Family History  Problem Relation Age of Onset  . Hypertension Mother   . Asthma Mother   . Other Mother     "pre-skin cancer"  . Heart attack Neg Hx   . Diabetes Neg Hx   . Anesthesia problems Neg Hx   . Kidney cancer Father   . Skin cancer Father   . Cancer Paternal Grandfather     colon    Allergies  Allergen Reactions  . Aspirin Anaphylaxis  . Nsaids Shortness Of Breath and Swelling  .  Other Rash    TREE NUTS  . Penicillins Hives    Current Outpatient Prescriptions on File Prior to Visit  Medication Sig Dispense Refill  . Multiple Vitamin (MULTIVITAMIN WITH MINERALS) TABS tablet Take 1 tablet by mouth daily. Reported on 01/30/2016     No current facility-administered medications on file prior to visit.    BP 126/86 mmHg  Pulse 82  Temp(Src) 98 F (36.7 C) (Oral)  Resp 16  Ht 5' 4.5" (1.638 m)  Wt 255 lb 12.8 oz (116.03 kg)  BMI 43.25 kg/m2  SpO2 98%    Objective:   Physical Exam  Physical Exam  Constitutional: She is oriented to person, place, and time. She appears well-developed and well-nourished. No distress.  HENT:  Head: Normocephalic and atraumatic.  Right Ear: Tympanic membrane and ear canal normal.  Left Ear: Tympanic membrane and ear canal normal.  Mouth/Throat: Oropharynx is clear and moist.  Eyes: Pupils are  equal, round, and reactive to light. No scleral icterus.  Neck: Normal range of motion. No thyromegaly present.  Cardiovascular: Normal rate and regular rhythm.   No murmur heard. Pulmonary/Chest: Effort normal and breath sounds normal. No respiratory distress. He has no wheezes. She has no rales. She exhibits no tenderness.  Abdominal: Soft. Bowel sounds are normal. He exhibits no distension and no mass. There is no tenderness. There is no rebound and no guarding.  Musculoskeletal: She exhibits no edema.  Lymphadenopathy:    She has no cervical adenopathy.  Neurological: She is alert and oriented to person, place, and time. She has normal patellar reflexes. She exhibits normal muscle tone. Coordination normal.  Skin: Skin is warm and dry.  Psychiatric: She has a normal mood and affect. Her behavior is normal. Judgment and thought content normal.  Breasts: Examined lying Right: Without masses, retractions, discharge or axillary adenopathy.  Left: Without masses, retractions, discharge or axillary adenopathy.       Assessment & Plan:         Assessment & Plan:  Preventative Care- discussed diet/exercise and weight loss.  Pap, immunizations up to date. Obtain routine lab work.   Sandford Craze NP

## 2016-02-16 NOTE — Patient Instructions (Signed)
Please complete lab work prior to leaving. Schedule a vision exam at your convenience. Continue to work on [employer] diet, exercise, weight loss.

## 2016-02-17 ENCOUNTER — Encounter: Payer: Self-pay | Admitting: Physician Assistant

## 2016-02-17 ENCOUNTER — Ambulatory Visit (HOSPITAL_BASED_OUTPATIENT_CLINIC_OR_DEPARTMENT_OTHER)
Admission: RE | Admit: 2016-02-17 | Discharge: 2016-02-17 | Disposition: A | Discharge status: Home / Self Care | Payer: BC Managed Care – PPO | Source: Ambulatory Visit | Attending: Physician Assistant | Admitting: Physician Assistant

## 2016-02-17 ENCOUNTER — Ambulatory Visit (INDEPENDENT_AMBULATORY_CARE_PROVIDER_SITE_OTHER): Payer: BC Managed Care – PPO | Admitting: Physician Assistant

## 2016-02-17 VITALS — BP 118/78 | HR 91 | Temp 98.6°F | Resp 16 | Ht 64.0 in | Wt 249.2 lb

## 2016-02-17 DIAGNOSIS — W19XXXA Unspecified fall, initial encounter: Secondary | ICD-10-CM

## 2016-02-17 DIAGNOSIS — M25521 Pain in right elbow: Secondary | ICD-10-CM | POA: Insufficient documentation

## 2016-02-17 DIAGNOSIS — M25531 Pain in right wrist: Secondary | ICD-10-CM | POA: Insufficient documentation

## 2016-02-17 DIAGNOSIS — T1490 Injury, unspecified: Secondary | ICD-10-CM

## 2016-02-17 DIAGNOSIS — S6991XA Unspecified injury of right wrist, hand and finger(s), initial encounter: Secondary | ICD-10-CM | POA: Diagnosis not present

## 2016-02-17 DIAGNOSIS — S59901A Unspecified injury of right elbow, initial encounter: Secondary | ICD-10-CM | POA: Insufficient documentation

## 2016-02-17 MED ORDER — TRAMADOL HCL 50 MG PO TABS: 50.0000 mg | ORAL_TABLET | Freq: Three times a day (TID) | ORAL | Status: AC | PRN

## 2016-02-17 NOTE — Patient Instructions (Signed)
Please move arm to avoid stiffness but listen to your body. Don't overdue it! Wear the sling as directed.  Please alterate ice and heat to the area. Take pain medication as directed. Once improving you can switch to Tylenol instead.

## 2016-02-17 NOTE — Progress Notes (Signed)
Patient presents to clinic today c/o pain in R wrist and elbow with decreased ROM of forearm and swelling after sustaining a fall this morning. States she slipped on a pice of side walk chalk and fell onto her R arm. Denies head trauma or LOC. Endorses some R wrist pain but significant R elbow pain since that time. Denies numbness or tingling of extremity.   Past Medical History  Diagnosis Date  . Allergy   . Scoliosis   . No pertinent past medical history   . Asthma   . PP care - s/p NVB 4/30 12/05/2011  . Pneumonia   . Anxiety     not taking anything at present time  . Hx: UTI (urinary tract infection)   . Headache     tension headaches  . Complication of anesthesia     woke up itching after tonsillectomy  . PONV (postoperative nausea and vomiting)     after tonsillectomy    Current Outpatient Prescriptions on File Prior to Visit  Medication Sig Dispense Refill  . Albuterol Sulfate (VENTOLIN HFA IN) Inhale 1 puff into the lungs as needed.    . Multiple Vitamin (MULTIVITAMIN WITH MINERALS) TABS tablet Take 1 tablet by mouth daily. Reported on 01/30/2016     No current facility-administered medications on file prior to visit.    Allergies  Allergen Reactions  . Aspirin Anaphylaxis  . Nsaids Shortness Of Breath and Swelling  . Other Rash    TREE NUTS  . Penicillins Hives    Family History  Problem Relation Age of Onset  . Hypertension Mother   . Asthma Mother   . Other Mother     "pre-skin cancer"  . Heart attack Neg Hx   . Diabetes Neg Hx   . Anesthesia problems Neg Hx   . Kidney cancer Father   . Skin cancer Father   . Cancer Paternal Grandfather     colon    Social History   Social History  . Marital Status: Married    Spouse Name: N/A  . Number of Children: N/A  . Years of Education: N/A   Social History Main Topics  . Smoking status: Never Smoker   . Smokeless tobacco: Never Used  . Alcohol Use: 0.0 oz/week    0 Standard drinks or equivalent per  week     Comment: social - occasional  . Drug Use: No  . Sexual Activity: Yes    Birth Control/ Protection: IUD   Other Topics Concern  . 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 Spends a lot of time outside   Enjoys reading   Review of Systems - See HPI.  All other ROS are negative.  Ht 5\' 4"  (1.626 m)  Wt 249 lb 4 oz (113.059 kg)  BMI 42.76 kg/m2  Physical Exam  Constitutional: She is oriented to person, place, and time and well-developed, well-nourished, and in no distress.  Cardiovascular: Normal rate, regular rhythm, normal heart sounds and intact distal pulses.   Pulmonary/Chest: Effort normal and breath sounds normal. No respiratory distress. She has no wheezes. She has no rales. She exhibits no tenderness.  Musculoskeletal:       Right elbow: She exhibits decreased range of motion and swelling. Tenderness found. Medial epicondyle and olecranon process tenderness noted.  Right wrist: She exhibits swelling. She exhibits normal range of motion and no tenderness.       Right upper arm: Normal.       Right hand: Normal.  Neurological: She is alert and oriented to person, place, and time.  Skin: Skin is warm and dry. No rash noted.  Vitals reviewed.   Recent Results (from the past 2160 hour(s))  Basic metabolic panel     Status: None   Collection Time: 02/16/16  7:54 AM  Result Value Ref Range   Sodium 139 135 - 145 mEq/L   Potassium 4.0 3.5 - 5.1 mEq/L   Chloride 107 96 - 112 mEq/L   CO2 28 19 - 32 mEq/L   Glucose, Bld 89 70 - 99 mg/dL   BUN 9 6 - 23 mg/dL   Creatinine, Ser 4.090.61 0.40 - 1.20 mg/dL   Calcium 9.3 8.4 - 81.110.5 mg/dL   GFR 914.78116.40 >29.56>60.00 mL/min  CBC with Differential/Platelet     Status: None   Collection Time: 02/16/16  7:54 AM  Result Value Ref Range   WBC 7.6 4.0 - 10.5 K/uL   RBC 4.68 3.87 - 5.11 Mil/uL   Hemoglobin 14.5  12.0 - 15.0 g/dL   HCT 21.342.7 08.636.0 - 57.846.0 %   MCV 91.3 78.0 - 100.0 fl   MCHC 34.0 30.0 - 36.0 g/dL   RDW 46.913.5 62.911.5 - 52.815.5 %   Platelets 295.0 150.0 - 400.0 K/uL   Neutrophils Relative % 55.0 43.0 - 77.0 %   Lymphocytes Relative 35.3 12.0 - 46.0 %   Monocytes Relative 5.8 3.0 - 12.0 %   Eosinophils Relative 3.4 0.0 - 5.0 %   Basophils Relative 0.5 0.0 - 3.0 %   Neutro Abs 4.2 1.4 - 7.7 K/uL   Lymphs Abs 2.7 0.7 - 4.0 K/uL   Monocytes Absolute 0.4 0.1 - 1.0 K/uL   Eosinophils Absolute 0.3 0.0 - 0.7 K/uL   Basophils Absolute 0.0 0.0 - 0.1 K/uL  Hepatic function panel     Status: None   Collection Time: 02/16/16  7:54 AM  Result Value Ref Range   Total Bilirubin 0.5 0.2 - 1.2 mg/dL   Bilirubin, Direct 0.1 0.0 - 0.3 mg/dL   Alkaline Phosphatase 60 39 - 117 U/L   AST 16 0 - 37 U/L   ALT 26 0 - 35 U/L   Total Protein 7.1 6.0 - 8.3 g/dL   Albumin 4.0 3.5 - 5.2 g/dL  Lipid panel     Status: None   Collection Time: 02/16/16  7:54 AM  Result Value Ref Range   Cholesterol 134 0 - 200 mg/dL    Comment: ATP III Classification       Desirable:  < 200 mg/dL               Borderline High:  200 - 239 mg/dL          High:  > = 413240 mg/dL   Triglycerides 24.468.0 0.0 - 149.0 mg/dL    Comment: Normal:  <010<150 mg/dLBorderline High:  150 - 199 mg/dL   HDL 27.2540.60 >36.64>39.00 mg/dL   VLDL 40.313.6 0.0 - 47.440.0 mg/dL   LDL Cholesterol 80 0 - 99 mg/dL   Total CHOL/HDL Ratio 3     Comment:                Men          Women1/2 Average Risk     3.4  3.3Average Risk          5.0          4.42X Average Risk          9.6          7.13X Average Risk          15.0          11.0                       NonHDL 93.67     Comment: NOTE:  Non-HDL goal should be 30 mg/dL higher than patient's LDL goal (i.e. LDL goal of < 70 mg/dL, would have non-HDL goal of < 100 mg/dL)  TSH     Status: None   Collection Time: 02/16/16  7:54 AM  Result Value Ref Range   TSH 2.22 0.35 - 4.50 uIU/mL  Urinalysis, Routine w reflex microscopic (not  at [facility])     Status: Abnormal   Collection Time: 02/16/16  7:54 AM  Result Value Ref Range   Color, Urine YELLOW Yellow;Lt. Yellow   APPearance CLEAR Clear   Specific Gravity, Urine <=1.005 (A) 1.000 - 1.030   pH 6.5 5.0 - 8.0   Total Protein, Urine NEGATIVE Negative   Urine Glucose NEGATIVE Negative   Ketones, ur NEGATIVE Negative   Bilirubin Urine NEGATIVE Negative   Hgb urine dipstick NEGATIVE Negative   Urobilinogen, UA 0.2 0.0 - 1.0   Leukocytes, UA NEGATIVE Negative   Nitrite NEGATIVE Negative   WBC, UA 0-2/hpf 0-2/hpf   RBC / HPF 0-2/hpf 0-2/hpf   Squamous Epithelial / LPF Rare(0-4/hpf) Rare(0-4/hpf)   Bacteria, UA Few(10-50/hpf) (A) None    Assessment/Plan: 1. Fall with injury Concern for fracture giving pain with palpation and decreased supination secondary to pain. X-rays obtained and negative for fracture. Supportive measures and OTC mediations reviewed with patient. Rx Tramadol for significant pain. Sling given. ROM as tolerated.  - DG Wrist Complete Right; Future - DG Elbow Complete Right; Future   Piedad Climes, PA-C

## 2016-02-17 NOTE — Progress Notes (Signed)
Pre visit review using our clinic review tool, if applicable. No additional management support is needed unless otherwise documented below in the visit note/SLS  

## 2016-02-22 NOTE — Addendum Note (Signed)
Addended by: Sandford Craze'SULLIVAN, Tyriek Hofman on: 02/22/2016 12:57 PM   Modules accepted: Kipp BroodSmartSet

## 2016-07-09 ENCOUNTER — Ambulatory Visit (INDEPENDENT_AMBULATORY_CARE_PROVIDER_SITE_OTHER): Payer: BC Managed Care – PPO

## 2016-07-09 DIAGNOSIS — Z23 Encounter for immunization: Secondary | ICD-10-CM | POA: Diagnosis not present

## 2016-11-01 IMAGING — CR DG ELBOW COMPLETE 3+V*R*
4 series · 4 of 4 positions shown · non-contrast
Comparison: None.

CLINICAL DATA: Right elbow pain, fall this morning

EXAM:
RIGHT ELBOW - COMPLETE 3+ VIEW

[x elbow joint ap right]
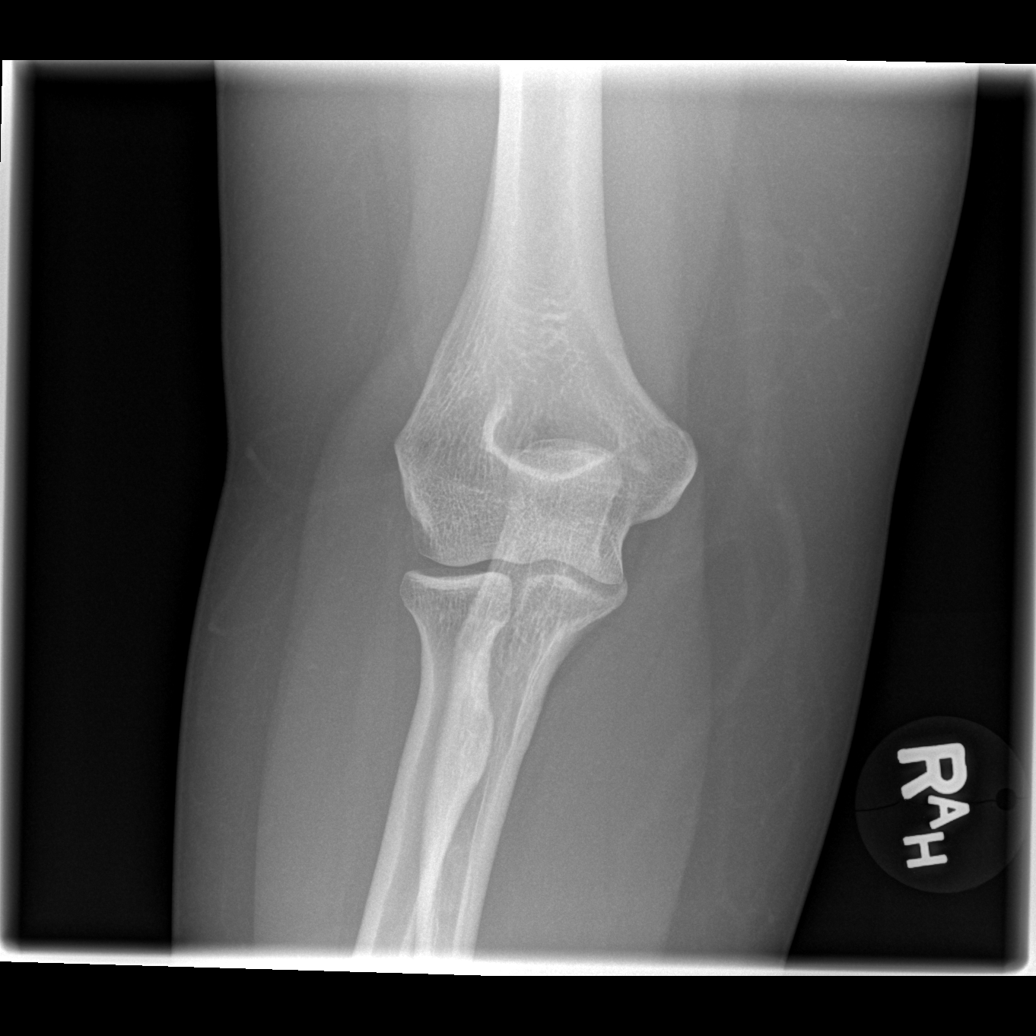

[x elbow joint obl. right (1 of 2)]
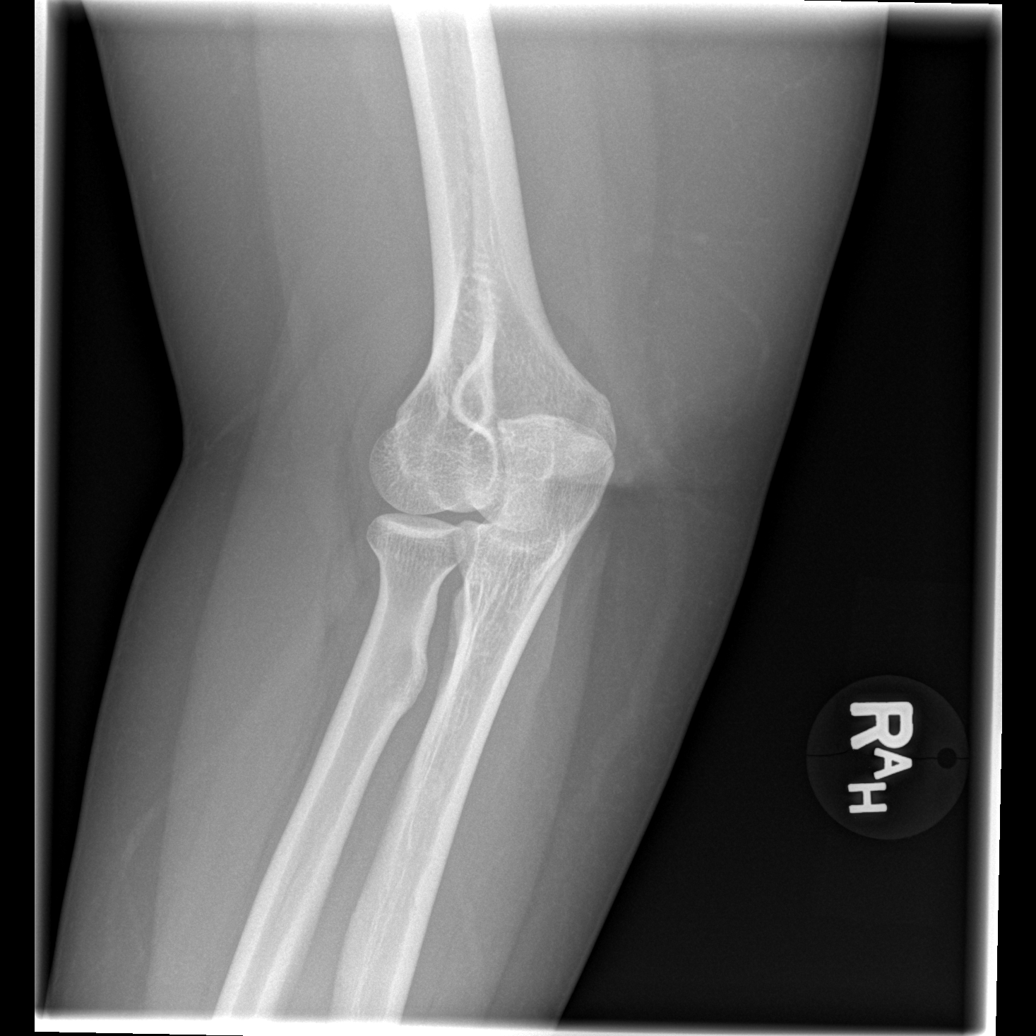

[x elbow joint obl. right (2 of 2)]
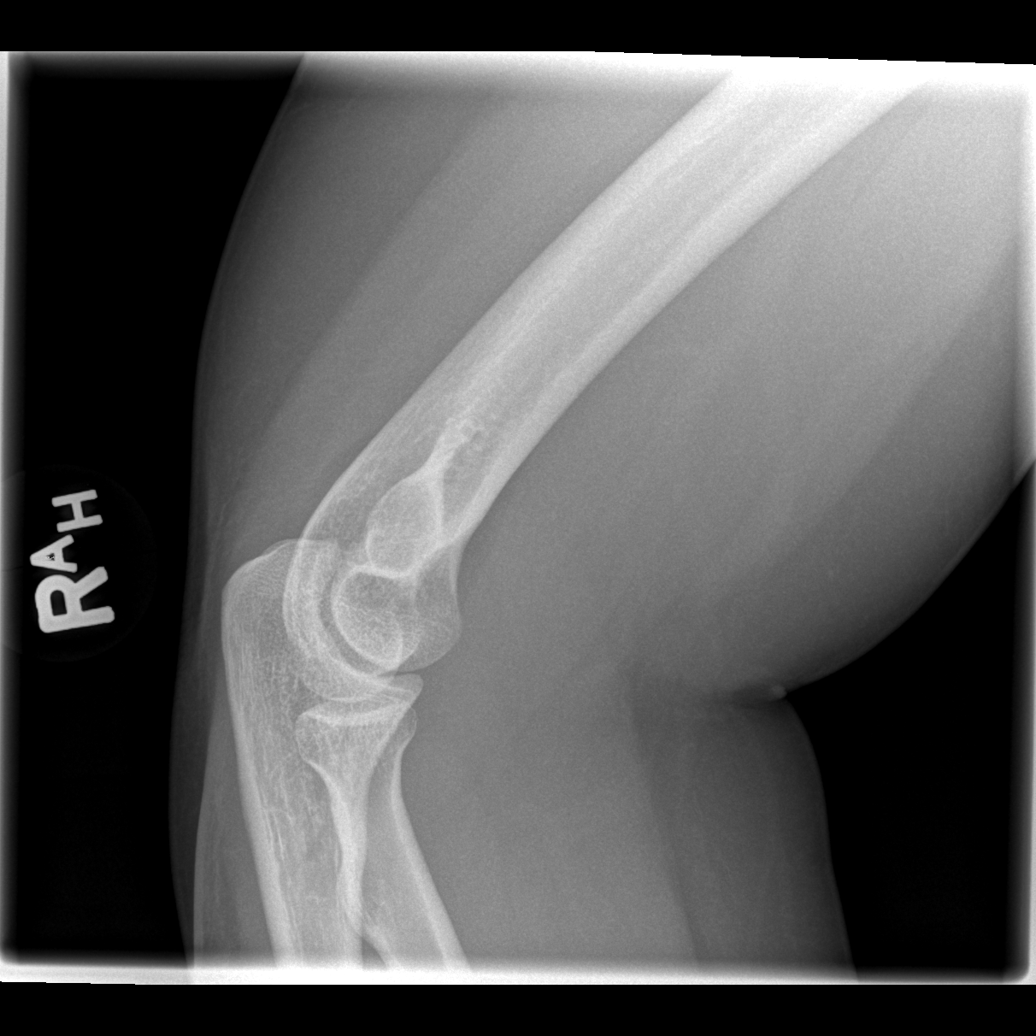

[x elbow joint lat right]
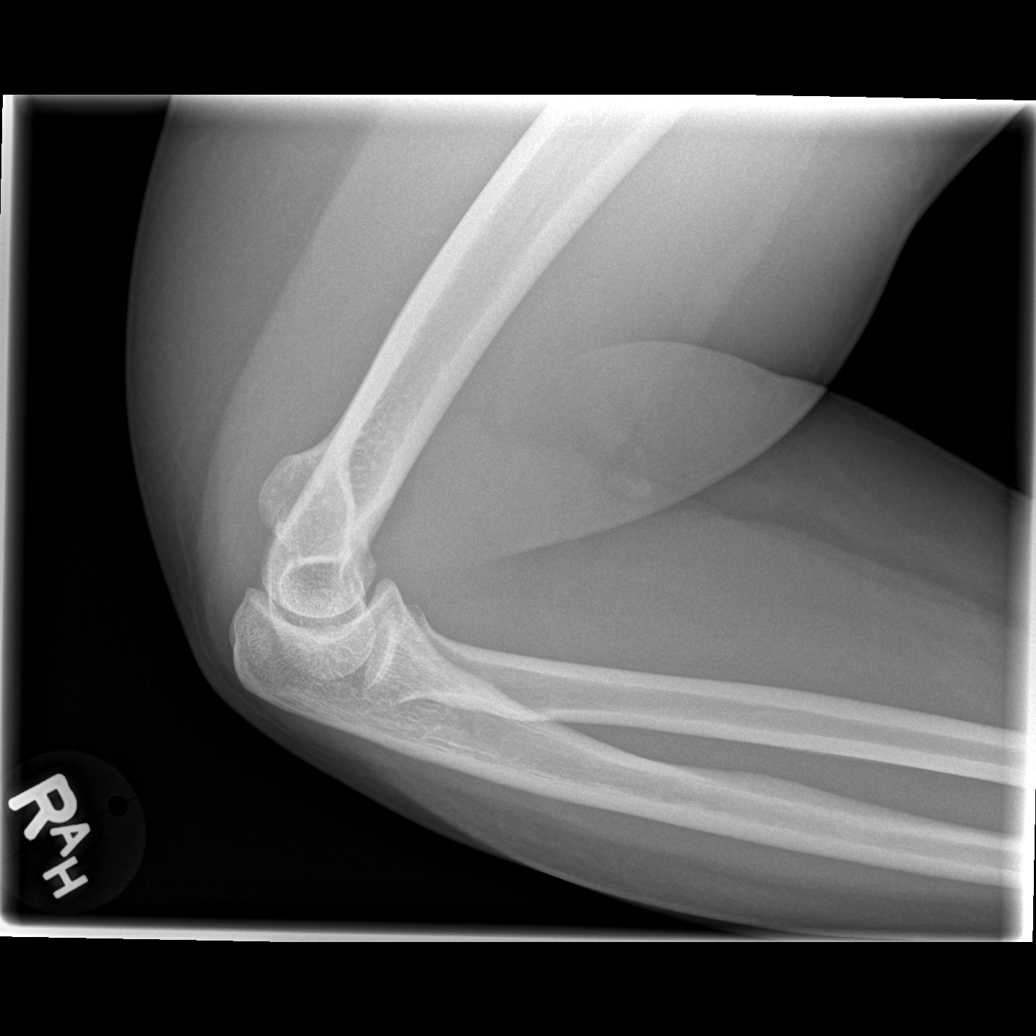

[4 of 4 positions shown; findings below may reference images not displayed]

FINDINGS: Four views of the right elbow submitted. No acute fracture or
subluxation. No radiopaque foreign body. No posterior fat pad sign.
IMPRESSION: Negative.
